# Patient Record
Sex: Male | Born: 1985 | Race: White | Hispanic: No | Marital: Married | Smoking: Current every day smoker
Health system: Southern US, Community
[De-identification: ages and names within clinical notes are randomized; demographics above are authoritative.]

---
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
## 2004-02-17 ENCOUNTER — Emergency Department (HOSPITAL_COMMUNITY): Admission: EM | Admit: 2004-02-17 | Discharge: 2004-02-17 | Payer: Self-pay | Admitting: Emergency Medicine

## 2004-09-04 ENCOUNTER — Emergency Department (HOSPITAL_COMMUNITY): Admission: EM | Admit: 2004-09-04 | Discharge: 2004-09-04 | Payer: Self-pay | Admitting: Emergency Medicine

## 2004-09-07 ENCOUNTER — Emergency Department (HOSPITAL_COMMUNITY): Admission: EM | Admit: 2004-09-07 | Discharge: 2004-09-07 | Payer: Self-pay | Admitting: Emergency Medicine

## 2007-01-03 ENCOUNTER — Emergency Department (HOSPITAL_COMMUNITY): Admission: EM | Admit: 2007-01-03 | Discharge: 2007-01-03 | Payer: Self-pay | Admitting: Emergency Medicine

## 2007-05-09 ENCOUNTER — Emergency Department (HOSPITAL_COMMUNITY): Admission: EM | Admit: 2007-05-09 | Discharge: 2007-05-09 | Payer: Self-pay | Admitting: Emergency Medicine

## 2009-02-13 ENCOUNTER — Emergency Department (HOSPITAL_BASED_OUTPATIENT_CLINIC_OR_DEPARTMENT_OTHER): Admission: EM | Admit: 2009-02-13 | Discharge: 2009-02-13 | Payer: Self-pay | Admitting: Emergency Medicine

## 2010-04-16 ENCOUNTER — Emergency Department (HOSPITAL_BASED_OUTPATIENT_CLINIC_OR_DEPARTMENT_OTHER)
Admission: EM | Admit: 2010-04-16 | Discharge: 2010-04-16 | Disposition: A | Discharge status: Home / Self Care | Payer: Self-pay | Attending: Emergency Medicine | Admitting: Emergency Medicine

## 2010-04-16 DIAGNOSIS — H5789 Other specified disorders of eye and adnexa: Secondary | ICD-10-CM | POA: Insufficient documentation

## 2010-04-16 DIAGNOSIS — F17200 Nicotine dependence, unspecified, uncomplicated: Secondary | ICD-10-CM | POA: Insufficient documentation

## 2010-04-16 DIAGNOSIS — H109 Unspecified conjunctivitis: Secondary | ICD-10-CM | POA: Insufficient documentation

## 2010-07-08 ENCOUNTER — Emergency Department (HOSPITAL_BASED_OUTPATIENT_CLINIC_OR_DEPARTMENT_OTHER)
Admission: EM | Admit: 2010-07-08 | Discharge: 2010-07-08 | Disposition: A | Discharge status: Home / Self Care | Payer: Self-pay | Attending: Emergency Medicine | Admitting: Emergency Medicine

## 2010-07-08 DIAGNOSIS — H5710 Ocular pain, unspecified eye: Secondary | ICD-10-CM | POA: Insufficient documentation

## 2011-05-19 ENCOUNTER — Emergency Department (HOSPITAL_COMMUNITY)
Admission: EM | Admit: 2011-05-19 | Discharge: 2011-05-19 | Disposition: A | Discharge status: Home / Self Care | Payer: Self-pay | Attending: Emergency Medicine | Admitting: Emergency Medicine

## 2011-05-19 ENCOUNTER — Emergency Department (HOSPITAL_COMMUNITY): Payer: Self-pay

## 2011-05-19 ENCOUNTER — Encounter (HOSPITAL_COMMUNITY): Payer: Self-pay | Admitting: *Deleted

## 2011-05-19 DIAGNOSIS — M79609 Pain in unspecified limb: Secondary | ICD-10-CM | POA: Insufficient documentation

## 2011-05-19 DIAGNOSIS — M79641 Pain in right hand: Secondary | ICD-10-CM

## 2011-05-19 DIAGNOSIS — R296 Repeated falls: Secondary | ICD-10-CM | POA: Insufficient documentation

## 2011-05-19 DIAGNOSIS — Y9389 Activity, other specified: Secondary | ICD-10-CM | POA: Insufficient documentation

## 2011-05-19 MED ORDER — NAPROXEN 500 MG PO TABS: 500.0000 mg | ORAL_TABLET | Freq: Two times a day (BID) | ORAL | Status: AC

## 2011-05-19 NOTE — ED Provider Notes (Signed)
History     CSN: [id number]  Arrival date & time 05/19/11  1625   First MD Initiated Contact with Patient 05/19/11 1719      Chief Complaint  Patient presents with  . Fall  . Arm Pain    (Consider location/radiation/quality/duration/timing/severity/associated sxs/prior treatment) Patient is a 26 y.o. male presenting with fall and arm pain. The history is provided by the patient. No language interpreter was used.  Fall The fall occurred while recreating/playing. He fell from a height of 1 to 2 ft. He landed on grass. There was no blood loss. Point of impact: right hand. Pain location: right arm, extending to elbow. The pain is mild. He was ambulatory at the scene. There was no entrapment after the fall. There was no drug use involved in the accident. There was no alcohol use involved in the accident. The symptoms are aggravated by activity. He has tried nothing for the symptoms.  Arm Pain Associated symptoms include arthralgias.    History reviewed. No pertinent past medical history.  History reviewed. No pertinent past surgical history.  History reviewed. No pertinent family history.  History  Substance Use Topics  . Smoking status: Not on file  . Smokeless tobacco: Not on file  . Alcohol Use: Not on file      Review of Systems  Musculoskeletal: Positive for arthralgias.  All other systems reviewed and are negative.    Allergies  Review of patient's allergies indicates not on file.  Home Medications   Current Outpatient Rx  Name Route Sig Dispense Refill  . ACETAMINOPHEN 500 MG PO TABS Oral Take 1,500 mg by mouth every 6 (six) hours as needed. For pain    . NAPROXEN SODIUM 220 MG PO TABS Oral Take 440 mg by mouth every 12 (twelve) hours as needed. For pain      BP 127/61  Pulse 95  Temp(Src) 98 F (36.7 C) (Oral)  Resp 16  SpO2 98%  Physical Exam  Nursing note and vitals reviewed. Constitutional: He is oriented to person, place, and time. He appears  well-developed and well-nourished. No distress.  HENT:  Head: Normocephalic and atraumatic.  Eyes: Pupils are equal, round, and reactive to light.  Neck: Normal range of motion. Neck supple.  Cardiovascular: Normal rate, regular rhythm, normal heart sounds and intact distal pulses.   Pulmonary/Chest: Effort normal and breath sounds normal.  Abdominal: Soft.  Musculoskeletal: Normal range of motion. He exhibits tenderness.  Lymphadenopathy:    He has no cervical adenopathy.  Neurological: He is alert and oriented to person, place, and time.  Skin: Skin is warm and dry.  Psychiatric: He has a normal mood and affect. His behavior is normal. Judgment and thought content normal.    ED Course  Procedures (including critical care time)  Labs Reviewed - No data to display No results found.   No diagnosis found. Prior injury to right hand several months ago, not evaluated.  Recent fall with increase in pain of right hand.  Pain radiates to right elbow.  No deformity.  Strong grip strength.  No limitation in ROM of hand, wrist, or elbow.  Radiology results reviewed and discussed with patient.   Hand pain. MDM          Jimmye Norman, NP 05/19/11 1843

## 2011-05-19 NOTE — Discharge Instructions (Signed)
Musculoskeletal Pain   Musculoskeletal pain is muscle and boney aches and pains. These pains can occur in any part of the body. Your caregiver may treat you without knowing the cause of the pain. They may treat you if blood or urine tests, X-rays, and other tests were normal.   CAUSES   There is often not a definite cause or reason for these pains. These pains may be caused by a type of germ (virus). The discomfort may also come from overuse. Overuse includes working out too hard when your body is not fit. Boney aches also come from weather changes. Bone is sensitive to atmospheric pressure changes.   HOME CARE INSTRUCTIONS   Ask when your test results will be ready. Make sure you get your test results.   Only take over-the-counter or prescription medicines for pain, discomfort, or fever as directed by your caregiver. If you were given medications for your condition, do not drive, operate machinery or power tools, or sign legal documents for 24 hours. Do not drink alcohol. Do not take sleeping pills or other medications that may interfere with treatment.   Continue all activities unless the activities cause more pain. When the pain lessens, slowly resume normal activities. Gradually increase the intensity and duration of the activities or exercise.   During periods of severe pain, bed rest may be helpful. Lay or sit in any position that is comfortable.   Putting ice on the injured area.   Put ice in a bag.   Place a towel between your skin and the bag.   Leave the ice on for 15 to 20 minutes, 3 to 4 times a day.   Follow up with your caregiver for continued problems and no reason can be found for the pain. If the pain becomes worse or does not go away, it may be necessary to repeat tests or do additional testing. Your caregiver may need to look further for a possible cause.   SEEK IMMEDIATE MEDICAL CARE IF:   You have pain that is getting worse and is not relieved by medications.   You develop chest pain that is  associated with shortness or breath, sweating, feeling sick to your stomach (nauseous), or throw up (vomit).   Your pain becomes localized to the abdomen.   You develop any new symptoms that seem different or that concern you.   MAKE SURE YOU:   Understand these instructions.   Will watch your condition.   Will get help right away if you are not doing well or get worse.   Document Released: 01/13/2005 Document Revised: 01/02/2011 Document Reviewed: 09/03/2007   [employer]® Patient Information ©2012 [employer].

## 2011-05-19 NOTE — ED Notes (Signed)
To ED for eval of right arm pain since falling in yard while playing with son. Ambulatory. No deformity noted

## 2011-05-19 NOTE — ED Provider Notes (Signed)
Medical screening examination/treatment/procedure(s) were performed by non-physician practitioner and as supervising physician I was immediately available for consultation/collaboration.   Lyanne Co, MD 05/19/11 2026

## 2011-05-19 NOTE — ED Notes (Signed)
Pt has returned from xray

## 2012-09-12 IMAGING — CR DG HAND COMPLETE 3+V*R*
3 series · 3 of 3 positions shown · non-contrast
Comparison: 09/04/2004

CLINICAL DATA: Fall, right hand pain

RIGHT HAND - COMPLETE 3+ VIEW

[x hand pa right]
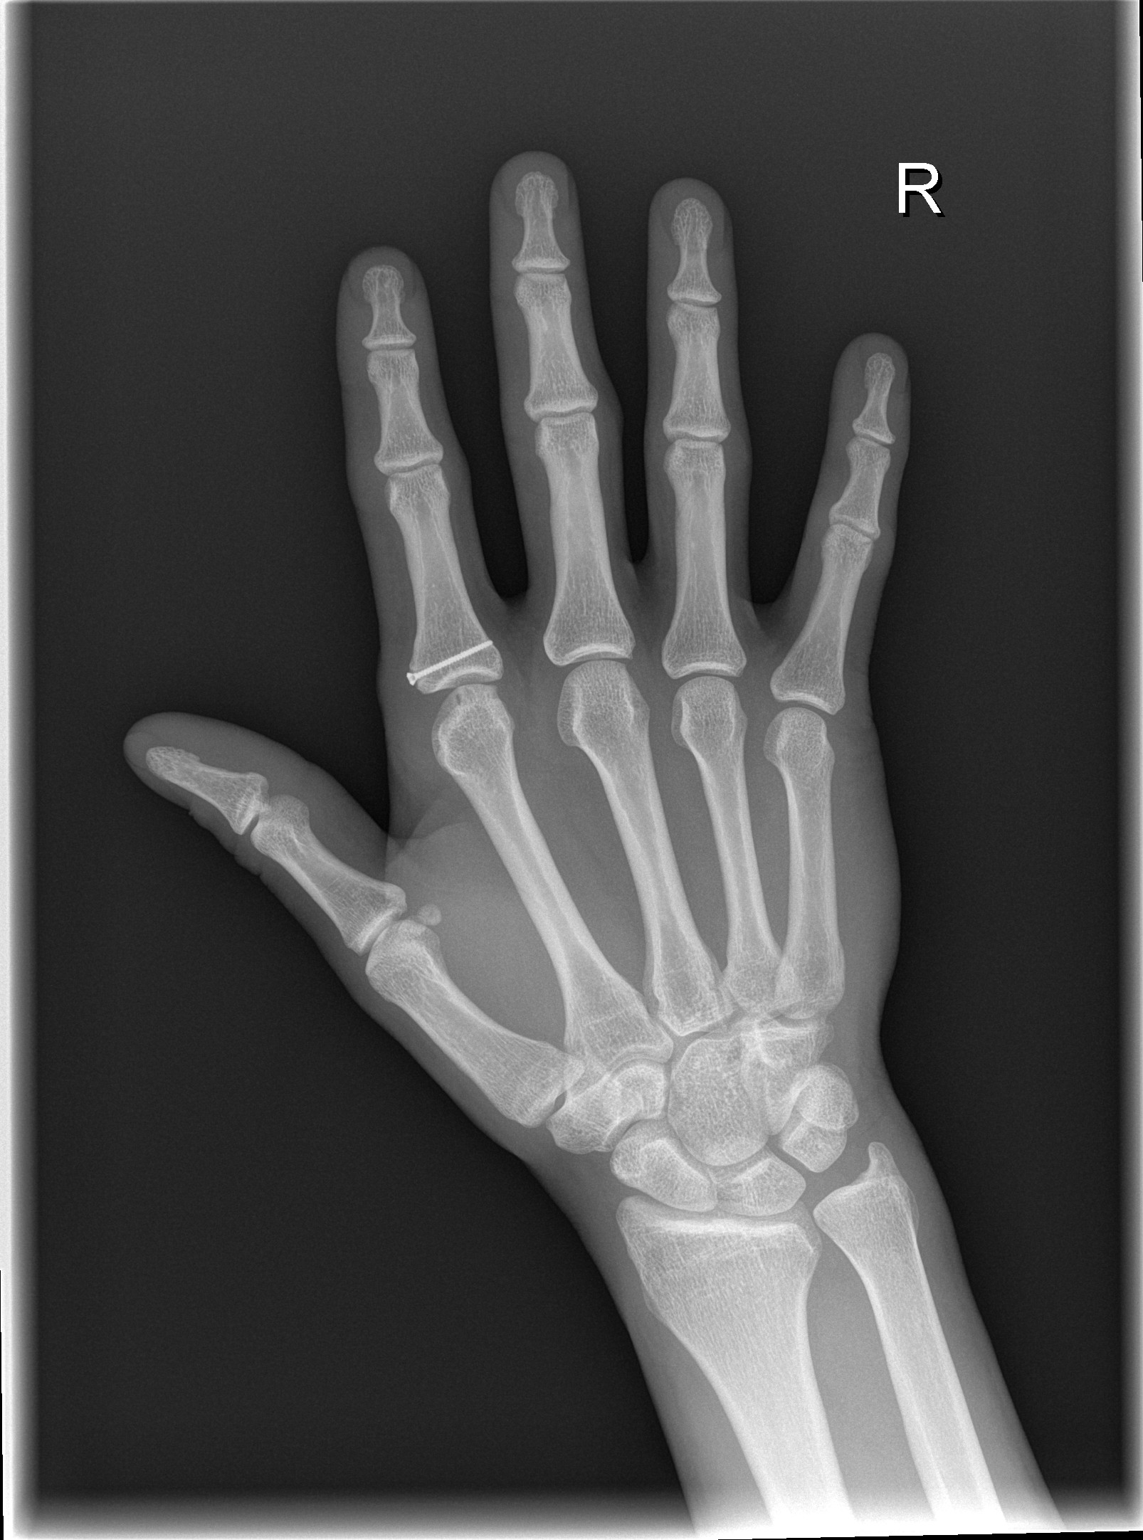

[x hand oblique right]
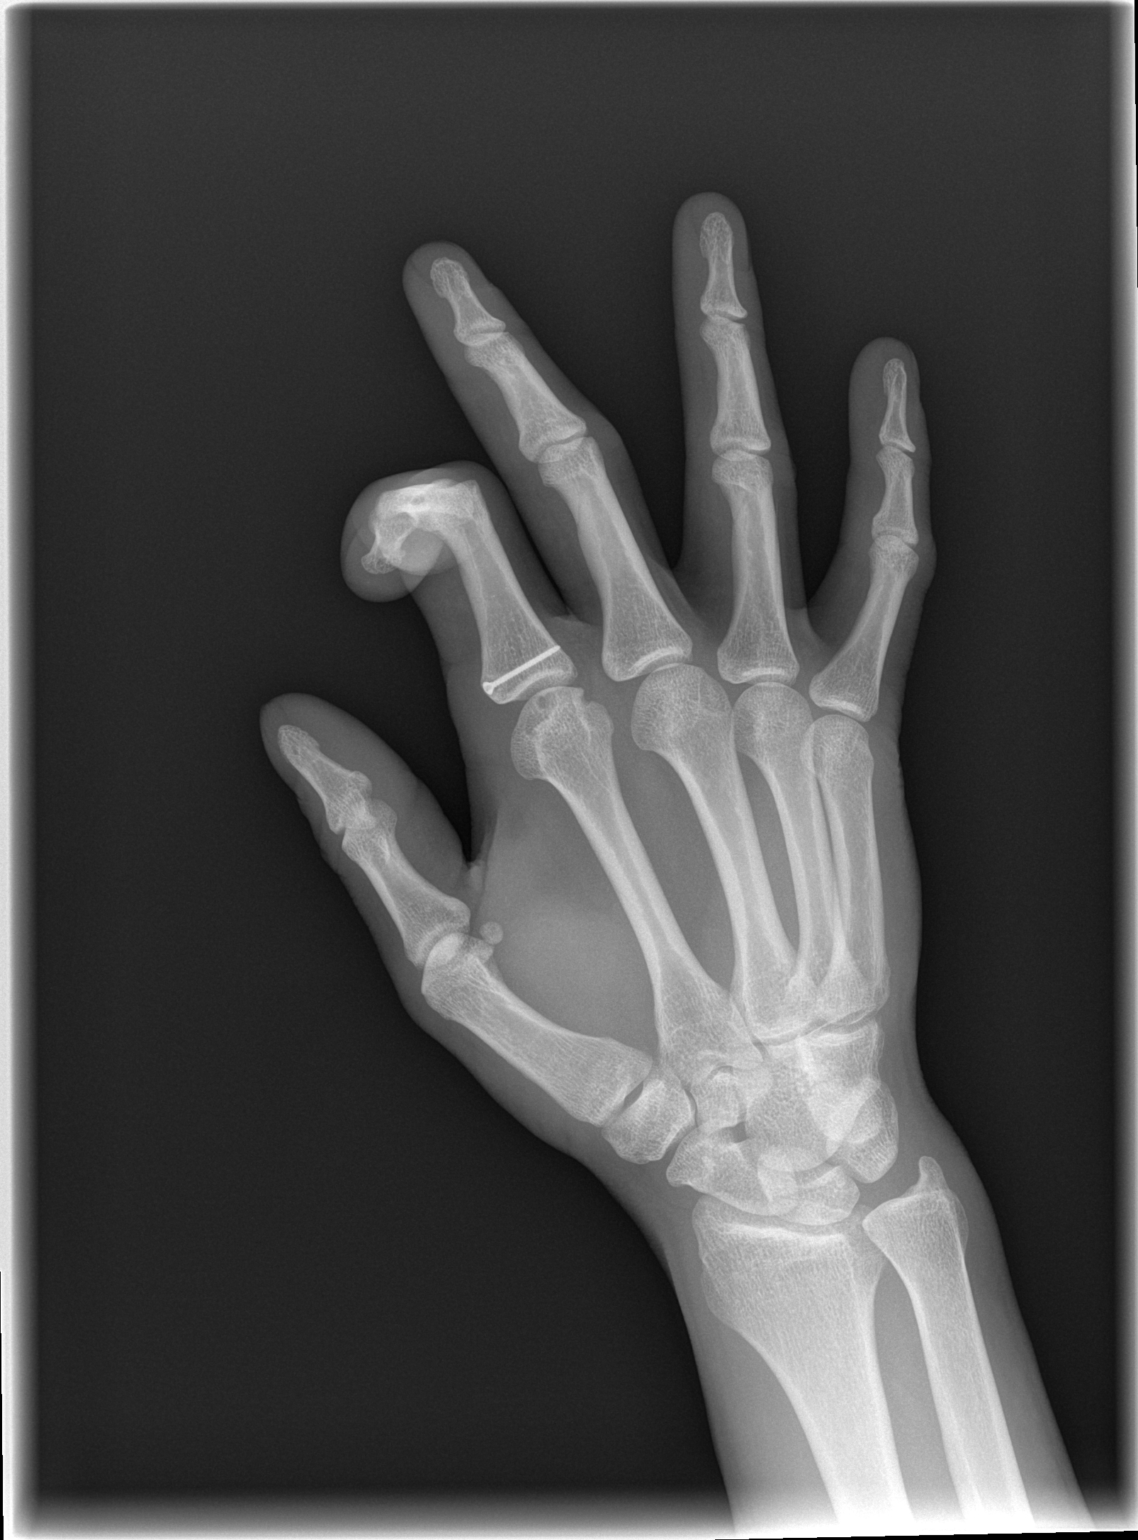

[x hand lat right]
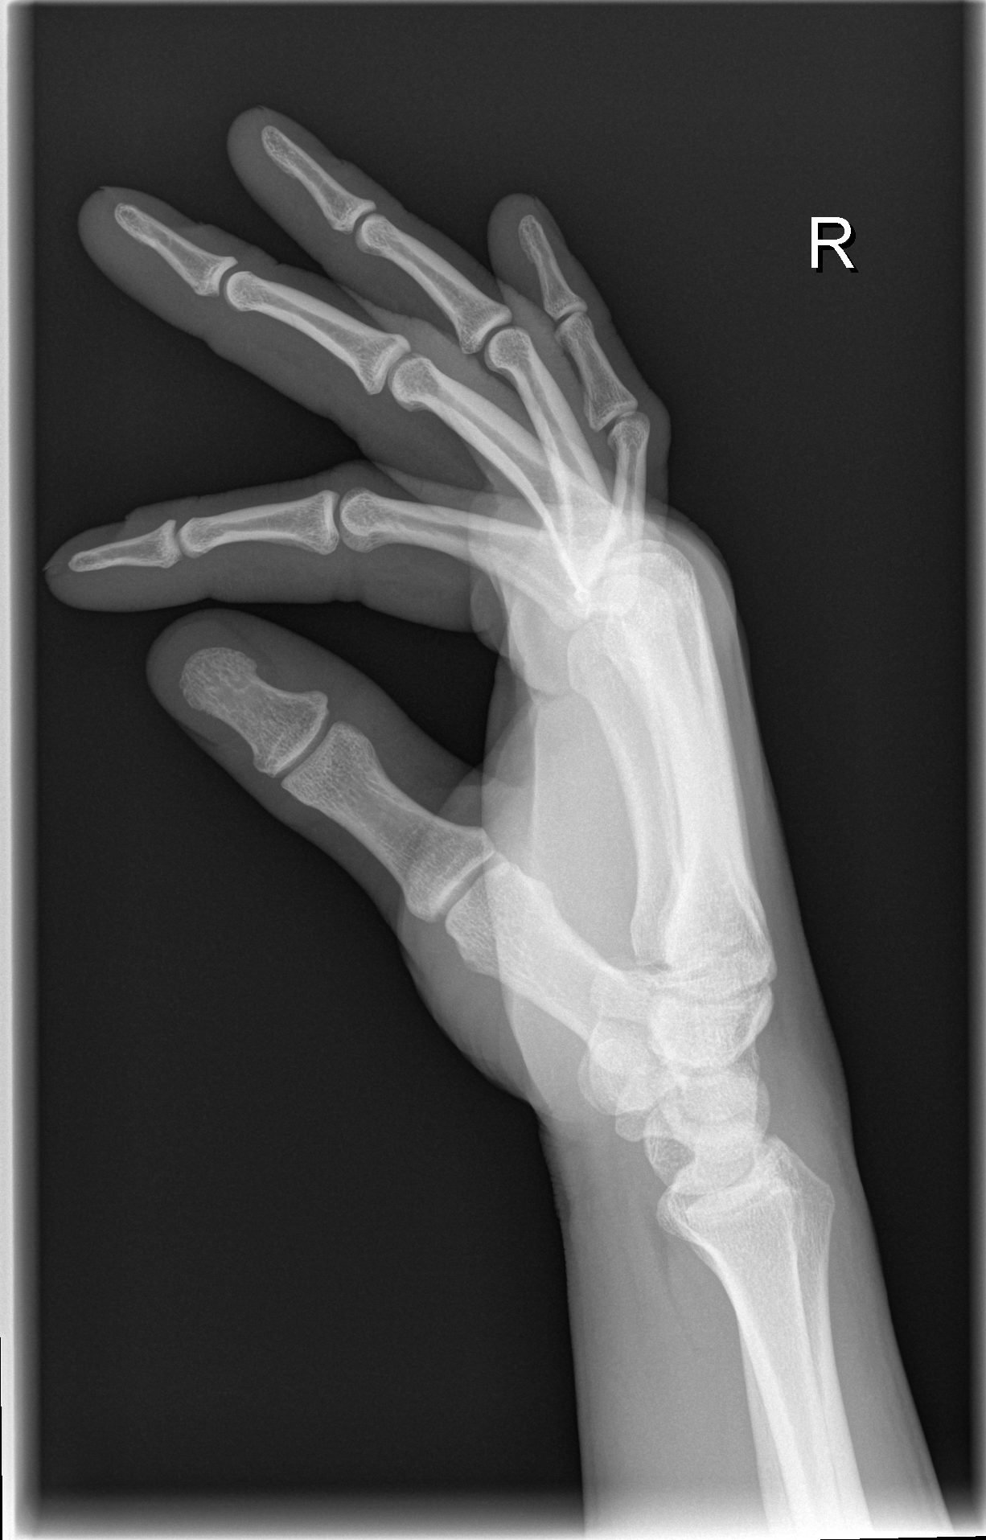

[3 of 3 positions shown; findings below may reference images not displayed]

FINDINGS: A screw traverses the base of the proximal phalanx of the
right second digit.  No fracture or dislocation.  No radiopaque
foreign body.  Subchondral cyst or periarticular erosion noted at
the head of the second metacarpal.
IMPRESSION: No acute finding.  Probable degenerative change of the head of the
second metacarpal.

## 2019-06-02 ENCOUNTER — Encounter (HOSPITAL_BASED_OUTPATIENT_CLINIC_OR_DEPARTMENT_OTHER): Payer: Self-pay

## 2019-06-02 ENCOUNTER — Emergency Department (HOSPITAL_BASED_OUTPATIENT_CLINIC_OR_DEPARTMENT_OTHER)
Admission: EM | Admit: 2019-06-02 | Discharge: 2019-06-02 | Disposition: A | Discharge status: Home / Self Care | Payer: Self-pay | Attending: Emergency Medicine | Admitting: Emergency Medicine

## 2019-06-02 ENCOUNTER — Other Ambulatory Visit: Payer: Self-pay

## 2019-06-02 DIAGNOSIS — H5711 Ocular pain, right eye: Secondary | ICD-10-CM | POA: Insufficient documentation

## 2019-06-02 DIAGNOSIS — Z5321 Procedure and treatment not carried out due to patient leaving prior to being seen by health care provider: Secondary | ICD-10-CM | POA: Insufficient documentation

## 2019-06-02 NOTE — ED Triage Notes (Signed)
Pt states he got metal from a grinder in his right eye yesterday-NAD-steady gait

## 2019-06-05 ENCOUNTER — Other Ambulatory Visit: Payer: Self-pay

## 2019-06-05 ENCOUNTER — Emergency Department (HOSPITAL_BASED_OUTPATIENT_CLINIC_OR_DEPARTMENT_OTHER)
Admission: EM | Admit: 2019-06-05 | Discharge: 2019-06-05 | Disposition: A | Discharge status: Home / Self Care | Payer: Medicaid Other | Attending: Emergency Medicine | Admitting: Emergency Medicine

## 2019-06-05 ENCOUNTER — Encounter (HOSPITAL_BASED_OUTPATIENT_CLINIC_OR_DEPARTMENT_OTHER): Payer: Self-pay

## 2019-06-05 DIAGNOSIS — Y999 Unspecified external cause status: Secondary | ICD-10-CM | POA: Insufficient documentation

## 2019-06-05 DIAGNOSIS — Y9289 Other specified places as the place of occurrence of the external cause: Secondary | ICD-10-CM | POA: Insufficient documentation

## 2019-06-05 DIAGNOSIS — Z79899 Other long term (current) drug therapy: Secondary | ICD-10-CM | POA: Insufficient documentation

## 2019-06-05 DIAGNOSIS — Y9389 Activity, other specified: Secondary | ICD-10-CM | POA: Insufficient documentation

## 2019-06-05 DIAGNOSIS — X58XXXA Exposure to other specified factors, initial encounter: Secondary | ICD-10-CM | POA: Insufficient documentation

## 2019-06-05 DIAGNOSIS — S01141A Puncture wound with foreign body of right eyelid and periocular area, initial encounter: Secondary | ICD-10-CM | POA: Insufficient documentation

## 2019-06-05 DIAGNOSIS — S0501XA Injury of conjunctiva and corneal abrasion without foreign body, right eye, initial encounter: Secondary | ICD-10-CM | POA: Insufficient documentation

## 2019-06-05 DIAGNOSIS — F17210 Nicotine dependence, cigarettes, uncomplicated: Secondary | ICD-10-CM | POA: Insufficient documentation

## 2019-06-05 MED ORDER — FLUORESCEIN SODIUM 1 MG OP STRP
1.0000 | ORAL_STRIP | Freq: Once | OPHTHALMIC | Status: AC
  Administered 2019-06-05: 1 via OPHTHALMIC

## 2019-06-05 MED ORDER — TETRACAINE HCL 0.5 % OP SOLN
OPHTHALMIC | Status: AC
  Filled 2019-06-05: qty 4

## 2019-06-05 MED ORDER — TETRACAINE HCL 0.5 % OP SOLN
2.0000 [drp] | Freq: Once | OPHTHALMIC | Status: AC
  Administered 2019-06-05: 2 [drp] via OPHTHALMIC

## 2019-06-05 MED ORDER — OFLOXACIN 0.3 % OP SOLN
2.0000 [drp] | Freq: Four times a day (QID) | OPHTHALMIC | Status: DC
  Administered 2019-06-05: 2 [drp] via OPHTHALMIC
  Filled 2019-06-05: qty 5

## 2019-06-05 MED ORDER — FLUORESCEIN SODIUM 1 MG OP STRP
ORAL_STRIP | OPHTHALMIC | Status: AC
  Filled 2019-06-05: qty 1

## 2019-06-05 NOTE — ED Provider Notes (Signed)
[facility] EMERGENCY DEPARTMENT Provider Note  CSN: [id number] Arrival date & time: 06/05/19 0019  Chief Complaint(s) Eye Problem  HPI Zachary Anderson is a 34 y.o. male   The history is provided by the patient.  Eye Problem Location:  Right eye Quality:  Aching Severity:  Moderate Onset quality:  Gradual Duration:  3 days Timing:  Constant Progression:  Waxing and waning Chronicity:  New Context: foreign body   Foreign body:  Metal Relieved by:  Nothing Worsened by:  Contact and eye movement Associated symptoms: inflammation and redness   Risk factors: conjunctival hemorrhage     Past Medical History History reviewed. No pertinent past medical history. There are no problems to display for this patient.  Home Medication(s) Prior to Admission medications   Medication Sig Start Date End Date Taking? Authorizing Provider  acetaminophen (TYLENOL) 500 MG tablet Take 1,500 mg by mouth every 6 (six) hours as needed. For pain    [provider]  naproxen sodium (ANAPROX) 220 MG tablet Take 440 mg by mouth every 12 (twelve) hours as needed. For pain    [provider]                                                                                                                                    Past Surgical History History reviewed. No pertinent surgical history. Family History No family history on file.  Social History Social History   Tobacco Use  . Smoking status: Current Every Day Smoker    Types: Cigarettes  . Smokeless tobacco: Never Used  Substance Use Topics  . Alcohol use: Never  . Drug use: Yes    Types: Marijuana   Allergies Patient has no known allergies.  Review of Systems Review of Systems  Eyes: Positive for redness.   All other systems are reviewed and are negative for acute change except as noted in the HPI  Physical Exam Vital Signs  I have reviewed the triage vital signs BP 133/83 (BP Location: Right Arm)    Pulse 99   Temp 97.9 F (36.6 C) (Oral)   Resp 18   Ht 6' (1.829 m)   Wt 68.5 kg   SpO2 99%   BMI 20.48 kg/m   Physical Exam Vitals reviewed.  Constitutional:      General: He is not in acute distress.    Appearance: He is well-developed. He is not diaphoretic.  HENT:     Head: Normocephalic and atraumatic.     Jaw: No trismus.     Right Ear: External ear normal.     Left Ear: External ear normal.     Nose: Nose normal.  Eyes:     General: No scleral icterus.       Right eye: Foreign body (punctate, dark colored, on upper lid) present.     Conjunctiva/sclera:     Right eye: Right conjunctiva is injected. Hemorrhage  present. No exudate.    Pupils: Pupils are equal, round, and reactive to light.     Right eye: Corneal abrasion and fluorescein uptake present. Seidel exam negative.     Slit lamp exam:    Right eye: No hyphema or hypopyon.  Neck:     Trachea: Phonation normal.  Cardiovascular:     Rate and Rhythm: Normal rate and regular rhythm.  Pulmonary:     Effort: Pulmonary effort is normal. No respiratory distress.     Breath sounds: No stridor.  Abdominal:     General: There is no distension.  Musculoskeletal:        General: Normal range of motion.     Cervical back: Normal range of motion.  Neurological:     Mental Status: He is alert and oriented to person, place, and time.  Psychiatric:        Behavior: Behavior normal.     ED Results and Treatments Labs (all labs ordered are listed, but only abnormal results are displayed) Labs Reviewed - No data to display                                                                                                                       EKG  EKG Interpretation  Date/Time:    Ventricular Rate:    PR Interval:    QRS Duration:   QT Interval:    QTC Calculation:   R Axis:     Text Interpretation:        Radiology No results found.  Pertinent labs & imaging results that were available during my care of the  patient were reviewed by me and considered in my medical decision making (see chart for details).  Medications Ordered in ED Medications  tetracaine (PONTOCAINE) 0.5 % ophthalmic solution 2 drop (has no administration in time range)  fluorescein ophthalmic strip 1 strip (has no administration in time range)  fluorescein 1 MG ophthalmic strip (has no administration in time range)  tetracaine (PONTOCAINE) 0.5 % ophthalmic solution (has no administration in time range)  ofloxacin (OCUFLOX) 0.3 % ophthalmic solution 2 drop (has no administration in time range)                                                                                                                                    Procedures .Foreign Body Removal  Date/Time: 06/05/2019 2:28 AM Performed by: Drema Pry  Johnsie Cancel, MD Authorized by: Fatima Blank, MD  Consent: Verbal consent obtained. Consent given by: patient Patient understanding: patient states understanding of the procedure being performed Patient identity confirmed: verbally with patient Body area: eye Location details: right eyelid  Sedation: Patient sedated: no  Localization method: eyelid eversion, magnification and visualized Removal mechanism: moist cotton swab Eye examined with fluorescein. Fluorescein uptake. Corneal abrasion size: medium Corneal abrasion location: superior and lateral No residual rust ring present. Dressing: antibiotic drops Depth: superficial Complexity: simple 1 objects recovered. Objects recovered: metal Post-procedure assessment: foreign body removed Patient tolerance: patient tolerated the procedure well with no immediate complications    (including critical care time)  Medical Decision Making / ED Course I have reviewed the nursing notes for this encounter and the patient's prior records (if available in EHR or on provided paperwork).   JENNIFER HOLLAND was evaluated in Emergency Department on 06/05/2019 for  the symptoms described in the history of present illness. He was evaluated in the context of the global COVID-19 pandemic, which necessitated consideration that the patient might be at risk for infection with the SARS-CoV-2 virus that causes COVID-19. Institutional protocols and algorithms that pertain to the evaluation of patients at risk for COVID-19 are in a state of rapid change based on information released by regulatory bodies including the CDC and federal and state organizations. These policies and algorithms were followed during the patient's care in the ED.  Right eye metallic FB with corneal abrasion vs ulcer. Lid FB removed. No cellulitis noted. Ofloxacin drops given. Diluted tetracaine drops given for pain. Ophtho follow up.        Final Clinical Impression(s) / ED Diagnoses Final diagnoses:  Puncture wound of eyelid of right eye with foreign body, initial encounter  Abrasion of right cornea, initial encounter   The patient appears reasonably screened and/or stabilized for discharge and I doubt any other medical condition or other [facility] requiring further screening, evaluation, or treatment in the ED at this time prior to discharge. Safe for discharge with strict return precautions.  Disposition: Discharge  Condition: Good  I have discussed the results, Dx and Tx plan with the patient/family who expressed understanding and agree(s) with the plan. Discharge instructions discussed at length. The patient/family was given strict return precautions who verbalized understanding of the instructions. No further questions at time of discharge.    ED Discharge Orders    None       Follow Up: [facility], [address] [place] [id number] [phone]  Schedule an appointment as soon as possible for a visit  For close follow up to assess for eye scratches      This chart was dictated using voice recognition software.  Despite best efforts to proofread,   errors can occur which can change the documentation meaning.   Fatima Blank, MD 06/05/19 0230

## 2019-06-05 NOTE — Discharge Instructions (Addendum)
Tetracaine drop (numbing drop) use: 1 - 2 drops in affected eye q30-60 min as needed pain Disp: 2 - 3 mL **Not to exceed 24 - 48 hours of use

## 2019-06-05 NOTE — ED Triage Notes (Signed)
Pt states he has a foreign object in his R eye. Happened 3 days ago.

## 2020-03-15 ENCOUNTER — Ambulatory Visit (HOSPITAL_COMMUNITY): Payer: No Payment, Other | Admitting: Licensed Clinical Social Worker

## 2020-03-15 ENCOUNTER — Ambulatory Visit (HOSPITAL_COMMUNITY): Payer: No Payment, Other | Admitting: Psychiatry

## 2020-03-29 ENCOUNTER — Ambulatory Visit (HOSPITAL_COMMUNITY): Payer: No Payment, Other | Admitting: Physician Assistant

## 2020-06-08 ENCOUNTER — Other Ambulatory Visit: Payer: Self-pay

## 2020-06-08 ENCOUNTER — Emergency Department (HOSPITAL_BASED_OUTPATIENT_CLINIC_OR_DEPARTMENT_OTHER)
Admission: EM | Admit: 2020-06-08 | Discharge: 2020-06-08 | Disposition: A | Discharge status: Home / Self Care | Payer: Medicaid Other | Attending: Emergency Medicine | Admitting: Emergency Medicine

## 2020-06-08 ENCOUNTER — Encounter (HOSPITAL_BASED_OUTPATIENT_CLINIC_OR_DEPARTMENT_OTHER): Payer: Self-pay | Admitting: *Deleted

## 2020-06-08 DIAGNOSIS — F17210 Nicotine dependence, cigarettes, uncomplicated: Secondary | ICD-10-CM | POA: Insufficient documentation

## 2020-06-08 DIAGNOSIS — W208XXA Other cause of strike by thrown, projected or falling object, initial encounter: Secondary | ICD-10-CM | POA: Insufficient documentation

## 2020-06-08 DIAGNOSIS — S0502XA Injury of conjunctiva and corneal abrasion without foreign body, left eye, initial encounter: Secondary | ICD-10-CM | POA: Insufficient documentation

## 2020-06-08 DIAGNOSIS — S058X2A Other injuries of left eye and orbit, initial encounter: Secondary | ICD-10-CM

## 2020-06-08 DIAGNOSIS — Y9389 Activity, other specified: Secondary | ICD-10-CM | POA: Insufficient documentation

## 2020-06-08 MED ORDER — FLUORESCEIN SODIUM 1 MG OP STRP
1.0000 | ORAL_STRIP | Freq: Once | OPHTHALMIC | Status: AC
  Administered 2020-06-08: 1 via OPHTHALMIC
  Filled 2020-06-08: qty 1

## 2020-06-08 MED ORDER — TETRACAINE HCL 0.5 % OP SOLN
2.0000 [drp] | Freq: Once | OPHTHALMIC | Status: AC
  Administered 2020-06-08: 2 [drp] via OPHTHALMIC
  Filled 2020-06-08: qty 4

## 2020-06-08 MED ORDER — ERYTHROMYCIN 5 MG/GM OP OINT
TOPICAL_OINTMENT | Freq: Once | OPHTHALMIC | Status: AC
  Administered 2020-06-08: 1 via OPHTHALMIC
  Filled 2020-06-08: qty 3.5

## 2020-06-08 NOTE — ED Provider Notes (Signed)
[facility] EMERGENCY DEPARTMENT Provider Note   CSN: [id number] Arrival date & time: 06/08/20  1737     History Chief Complaint  Patient presents with  . Foreign Body in Eye    Zachary Anderson is a 35 y.o. male.  Patient presents to the emergency department today for evaluation of left eye irritation.  Patient states that he broke a glass car lightbulb the day before yesterday.  He states that he had a piece of glass embedded in his finger and when he used a knife to get the glass out of his finger, he had a foreign body go into his eye.  He has had irritation since that time.  No vision changes or vision loss.  His eye is red no surrounding swelling.  No fevers.  No treatments prior to arrival.  He does not wear contact lenses or glasses.  No previous surgeries of the eye.        History reviewed. No pertinent past medical history.  There are no problems to display for this patient.   History reviewed. No pertinent surgical history.     No family history on file.  Social History   Tobacco Use  . Smoking status: Current Every Day Smoker    Types: Cigarettes  . Smokeless tobacco: Never Used  Vaping Use  . Vaping Use: Never used  Substance Use Topics  . Alcohol use: Never  . Drug use: Yes    Types: Marijuana    Home Medications Prior to Admission medications   Medication Sig Start Date End Date Taking? Authorizing Provider  acetaminophen (TYLENOL) 500 MG tablet Take 1,500 mg by mouth every 6 (six) hours as needed. For pain    [provider]  naproxen sodium (ANAPROX) 220 MG tablet Take 440 mg by mouth every 12 (twelve) hours as needed. For pain    [provider]    Allergies    Patient has no known allergies.  Review of Systems   Review of Systems  Constitutional: Negative for fever.  HENT: Negative for rhinorrhea and sore throat.   Eyes: Positive for pain, discharge (tearing) and redness. Negative for photophobia and visual  disturbance.  Respiratory: Negative for cough.   Cardiovascular: Negative for chest pain.  Gastrointestinal: Negative for abdominal pain, diarrhea, nausea and vomiting.  Genitourinary: Negative for dysuria and hematuria.  Musculoskeletal: Negative for myalgias.  Skin: Negative for rash.  Neurological: Negative for headaches.    Physical Exam Updated Vital Signs BP 126/86 (BP Location: Right Arm)   Pulse 85   Temp 98.4 F (36.9 C) (Oral)   Resp 18   Ht 6' (1.829 m)   Wt 69.4 kg   SpO2 98%   BMI 20.75 kg/m   Physical Exam Vitals and nursing note reviewed.  Constitutional:      Appearance: He is well-developed.  HENT:     Head: Normocephalic and atraumatic.  Eyes:     Extraocular Movements:     Right eye: Normal extraocular motion.     Left eye: Normal extraocular motion.     Conjunctiva/sclera:     Right eye: Right conjunctiva is not injected.     Left eye: Left conjunctiva is injected.     Pupils: Pupils are equal, round, and reactive to light.     Right eye: Pupil is round, reactive and not sluggish.     Left eye: Pupil is round, reactive and not sluggish. Fluorescein uptake present. Seidel exam negative.  Slit lamp exam:    Left eye: No corneal ulcer or foreign body.     Comments: L sclera: there is injection and mild chemosis involving most of the lower half of the eye.  There is a small hemorrhage noted on the sclera about the 6 o'clock position.  There is very mild hazy fluorescein uptake of the outer half of this cornea without any discrete linear uptake suspicious for corneal abrasion.  No foreign body seen with eversion of the upper lid, lower lid.  I do not see any corneal foreign bodies.  I do not see any glass foreign bodies in the sclera when plate is applied tangentially with the ophthalmoscope.  Pulmonary:     Effort: No respiratory distress.  Musculoskeletal:     Cervical back: Normal range of motion and neck supple.  Skin:    General: Skin is warm and  dry.  Neurological:     Mental Status: He is alert.     ED Results / Procedures / Treatments   Labs (all labs ordered are listed, but only abnormal results are displayed) Labs Reviewed - No data to display  EKG None  Radiology No results found.  Procedures Procedures   Medications Ordered in ED Medications  erythromycin ophthalmic ointment (has no administration in time range)  fluorescein ophthalmic strip 1 strip (1 strip Right Eye Given 06/08/20 1809)  tetracaine (PONTOCAINE) 0.5 % ophthalmic solution 2 drop (2 drops Right Eye Given 06/08/20 1809)    ED Course  I have reviewed the triage vital signs and the nursing notes.  Pertinent labs & imaging results that were available during my care of the patient were reviewed by me and considered in my medical decision making (see chart for details).  Patient seen and examined.  Exam findings as above.  The sclera is most inflamed over the inferior half of the eye.  Suspect patient had a foreign body, which is now present, cause injury to the sclera.  He may have a very mild corneal abrasion laterally.  I spent a significant amount of time trying to identify any residual foreign bodies however was unable to do so.  Patient does have some tearing during exam likely contributing to decreased visual acuity.  His pupil responds appropriately to light.  He has full EOMs without deficit.  Vital signs reviewed and are as follows: BP 126/86 (BP Location: Right Arm)   Pulse 85   Temp 98.4 F (36.9 C) (Oral)   Resp 18   Ht 6' (1.829 m)   Wt 69.4 kg   SpO2 98%   BMI 20.75 kg/m    Will start on erythromycin ointment.  Today is Friday, if not improved by Monday, he is instructed to call ophthalmology referral.  If he develops any worsening pain, swelling around the eye, fever over the weekend he is to return for recheck.  Patient verbalized understanding agrees with plan.     Visual Acuity  Right Eye Distance: 20/30 Left Eye  Distance: 20/200 (only able to see top line r/t inury to eye) Bilateral Distance: 20/30  Right Eye Near:   Left Eye Near:    Bilateral Near:        MDM Rules/Calculators/A&P                          Patient with eye foreign body, likely causing scleral and corneal abrasion.  Gross vision is intact.  Do not suspect corneal laceration or  open globe.  No foreign bodies noted despite extensive exam. No surrounding erythema, swelling, vision changes/loss suspicious for orbital or periorbital cellulitis. No signs of iritis. No symptoms of retinal detachment. No ophthalmologic emergency suspected. Outpatient referral given in case of no/slow improvement.  Home with antibiotic ointment.    Final Clinical Impression(s) / ED Diagnoses Final diagnoses:  Abrasion of left cornea, initial encounter  Abrasion of sclera of left eye, initial encounter    Rx / DC Orders ED Discharge Orders    None       Renne Crigler, Cordelia Poche 06/08/20 1837    Jacalyn Lefevre, MD 06/08/20 1902

## 2020-06-08 NOTE — Discharge Instructions (Signed)
Please read and follow all provided instructions.  Your diagnoses today include:  1. Abrasion of left cornea, initial encounter   2. Abrasion of sclera of left eye, initial encounter     Tests performed today include:  Visual acuity testing to check your vision  Fluorescein dye examination to look for scratches on your eye  Vital signs. See below for your results today.   Medications prescribed:   Erythromycin  - antibiotic eye ointment  Use this medication as follows:  Apply 1/4" of the antibiotic ointment to affected eye up to 6 times a day while awake for 7 days  Take any prescribed medications only as directed.  Home care instructions:  Follow any educational materials contained in this packet.  You have a scratch of the eye on the cornea (the clear part of the eye). This condition may be caused by trauma. It is a common problem for people who wear contact lenses. Proper treatment is important. No evidence of infection is noted today, but you could develop an infection called a corneal ulcer or have some retained foreign body that may or may not have been noted today in the Emergency Department. Ulcers are not only painful, but they may also scar the cornea and cause permanent damage to the eye.   If you wear contact lenses, do not use them until your eye caregiver approves. Follow-up care is necessary to be sure the corneal abrasion is healing if not completely resolved in 2-3 days. See your caregiver or eye specialist as suggested for followup.   Follow-up instructions: Please follow-up with the opthalmologist listed in the next 2-3 days for further evaluation of your symptoms.   Return instructions:   Please return to the Emergency Department if you experience worsening symptoms.   Please return immediately if you develop severe pain, pus drainage, new change in vision, or fever.  Please return if you have any other emergent concerns.  Additional Information:  Your  vital signs today were: BP 126/86 (BP Location: Right Arm)   Pulse 85   Temp 98.4 F (36.9 C) (Oral)   Resp 18   Ht 6' (1.829 m)   Wt 69.4 kg   SpO2 98%   BMI 20.75 kg/m  If your blood pressure (BP) was elevated above 135/85 this visit, please have this repeated by your doctor within one month.

## 2020-06-08 NOTE — ED Triage Notes (Signed)
He feels like he has a piece of glass in his left eye. He was working on a car yesterday and the tail light broke.

## 2020-09-04 ENCOUNTER — Emergency Department (HOSPITAL_BASED_OUTPATIENT_CLINIC_OR_DEPARTMENT_OTHER)
Admission: EM | Admit: 2020-09-04 | Discharge: 2020-09-04 | Disposition: A | Discharge status: Home / Self Care | Payer: Medicaid Other | Attending: Emergency Medicine | Admitting: Emergency Medicine

## 2020-09-04 ENCOUNTER — Encounter (HOSPITAL_BASED_OUTPATIENT_CLINIC_OR_DEPARTMENT_OTHER): Payer: Self-pay | Admitting: Emergency Medicine

## 2020-09-04 DIAGNOSIS — W57XXXA Bitten or stung by nonvenomous insect and other nonvenomous arthropods, initial encounter: Secondary | ICD-10-CM | POA: Insufficient documentation

## 2020-09-04 DIAGNOSIS — S60460A Insect bite (nonvenomous) of right index finger, initial encounter: Secondary | ICD-10-CM | POA: Insufficient documentation

## 2020-09-04 DIAGNOSIS — Z5321 Procedure and treatment not carried out due to patient leaving prior to being seen by health care provider: Secondary | ICD-10-CM | POA: Insufficient documentation

## 2020-09-04 NOTE — ED Triage Notes (Signed)
Pt reports insect bite on right index finger 3 days ago. Pt has swelling and redness to the finger.

## 2020-09-05 ENCOUNTER — Encounter (HOSPITAL_BASED_OUTPATIENT_CLINIC_OR_DEPARTMENT_OTHER): Payer: Self-pay | Admitting: *Deleted

## 2020-09-05 ENCOUNTER — Other Ambulatory Visit: Payer: Self-pay

## 2020-09-05 ENCOUNTER — Emergency Department (HOSPITAL_BASED_OUTPATIENT_CLINIC_OR_DEPARTMENT_OTHER): Payer: Medicaid Other

## 2020-09-05 ENCOUNTER — Encounter (HOSPITAL_BASED_OUTPATIENT_CLINIC_OR_DEPARTMENT_OTHER): Payer: Self-pay

## 2020-09-05 ENCOUNTER — Emergency Department (HOSPITAL_BASED_OUTPATIENT_CLINIC_OR_DEPARTMENT_OTHER)
Admission: EM | Admit: 2020-09-05 | Discharge: 2020-09-05 | Discharge status: Against Medical Advice | Payer: Self-pay | Attending: Emergency Medicine | Admitting: Emergency Medicine

## 2020-09-05 ENCOUNTER — Emergency Department (HOSPITAL_BASED_OUTPATIENT_CLINIC_OR_DEPARTMENT_OTHER): Payer: Self-pay

## 2020-09-05 ENCOUNTER — Emergency Department (HOSPITAL_BASED_OUTPATIENT_CLINIC_OR_DEPARTMENT_OTHER)
Admission: EM | Admit: 2020-09-05 | Discharge: 2020-09-05 | Disposition: A | Discharge status: Home / Self Care | Payer: Medicaid Other | Attending: Emergency Medicine | Admitting: Emergency Medicine

## 2020-09-05 DIAGNOSIS — R2231 Localized swelling, mass and lump, right upper limb: Secondary | ICD-10-CM | POA: Insufficient documentation

## 2020-09-05 DIAGNOSIS — Z5321 Procedure and treatment not carried out due to patient leaving prior to being seen by health care provider: Secondary | ICD-10-CM | POA: Insufficient documentation

## 2020-09-05 DIAGNOSIS — L02511 Cutaneous abscess of right hand: Secondary | ICD-10-CM

## 2020-09-05 DIAGNOSIS — S60561A Insect bite (nonvenomous) of right hand, initial encounter: Secondary | ICD-10-CM | POA: Insufficient documentation

## 2020-09-05 DIAGNOSIS — W57XXXA Bitten or stung by nonvenomous insect and other nonvenomous arthropods, initial encounter: Secondary | ICD-10-CM | POA: Insufficient documentation

## 2020-09-05 DIAGNOSIS — F17210 Nicotine dependence, cigarettes, uncomplicated: Secondary | ICD-10-CM | POA: Insufficient documentation

## 2020-09-05 DIAGNOSIS — M79641 Pain in right hand: Secondary | ICD-10-CM | POA: Insufficient documentation

## 2020-09-05 LAB — COMPREHENSIVE METABOLIC PANEL
ALT: 42 U/L (ref 0–44)
AST: 37 U/L (ref 15–41)
Albumin: 4.2 g/dL (ref 3.5–5.0)
Alkaline Phosphatase: 78 U/L (ref 38–126)
Anion gap: 10 (ref 5–15)
BUN: 14 mg/dL (ref 6–20)
CO2: 26 mmol/L (ref 22–32)
Calcium: 8.9 mg/dL (ref 8.9–10.3)
Chloride: 100 mmol/L (ref 98–111)
Creatinine, Ser: 1.06 mg/dL (ref 0.61–1.24)
GFR, Estimated: >60 mL/min (ref >60)
Glucose, Bld: 115 mg/dL — ABNORMAL HIGH (ref 70–99)
Potassium: 4.1 mmol/L (ref 3.5–5.1)
Sodium: 136 mmol/L (ref 135–145)
Total Bilirubin: 0.9 mg/dL (ref 0.3–1.2)
Total Protein: 7.3 g/dL (ref 6.5–8.1)

## 2020-09-05 LAB — CBC WITH DIFFERENTIAL/PLATELET
Abs Immature Granulocytes: 0.06 10*3/uL (ref 0.00–0.07)
Basophils Absolute: 0.1 10*3/uL (ref 0.0–0.1)
Basophils Relative: 0 %
Eosinophils Absolute: 0.1 10*3/uL (ref 0.0–0.5)
Eosinophils Relative: 1 %
HCT: 39.8 % (ref 39.0–52.0)
Hemoglobin: 13.4 g/dL (ref 13.0–17.0)
Immature Granulocytes: 0 %
Lymphocytes Relative: 13 %
Lymphs Abs: 1.8 10*3/uL (ref 0.7–4.0)
MCH: 31 pg (ref 26.0–34.0)
MCHC: 33.7 g/dL (ref 30.0–36.0)
MCV: 92.1 fL (ref 80.0–100.0)
Monocytes Absolute: 1.1 10*3/uL — ABNORMAL HIGH (ref 0.1–1.0)
Monocytes Relative: 8 %
Neutro Abs: 10.7 10*3/uL — ABNORMAL HIGH (ref 1.7–7.7)
Neutrophils Relative %: 78 %
Platelets: 271 10*3/uL (ref 150–400)
RBC: 4.32 MIL/uL (ref 4.22–5.81)
RDW: 13.1 % (ref 11.5–15.5)
WBC: 13.8 10*3/uL — ABNORMAL HIGH (ref 4.0–10.5)
nRBC: 0 % (ref 0.0–0.2)

## 2020-09-05 LAB — LACTIC ACID, PLASMA: Lactic Acid, Venous: 1 mmol/L (ref 0.5–1.9)

## 2020-09-05 MED ORDER — VANCOMYCIN HCL IN DEXTROSE 1-5 GM/200ML-% IV SOLN: 1000.0000 mg | Freq: Once | INTRAVENOUS | Status: DC

## 2020-09-05 MED ORDER — VANCOMYCIN HCL 500 MG/100ML IV SOLN: 500.0000 mg | Freq: Once | INTRAVENOUS | Status: DC

## 2020-09-05 MED ORDER — VANCOMYCIN HCL 1500 MG/300ML IV SOLN
1500.0000 mg | Freq: Once | INTRAVENOUS | Status: DC
  Filled 2020-09-05: qty 300

## 2020-09-05 NOTE — ED Triage Notes (Signed)
Pt states he was bit by something when he was working on his car 4 days ago. Now has swelling and redness to hand and redness up arm, was here earlier waiting and left

## 2020-09-05 NOTE — ED Notes (Signed)
Per Registration staff PT LWBS. PT called multiple times over an hour and not visible in lobby at this time.

## 2020-09-05 NOTE — ED Notes (Signed)
Unable to locate pt in lobby or outside of ED

## 2020-09-05 NOTE — ED Notes (Signed)
Pt has not returned to dept.

## 2020-09-05 NOTE — ED Notes (Signed)
Pt walked to lobby to talk to family member.

## 2020-09-05 NOTE — ED Notes (Signed)
ED Provider at bedside. 

## 2020-09-05 NOTE — ED Provider Notes (Signed)
[facility] EMERGENCY DEPARTMENT Provider Note   CSN: [id number] Arrival date & time: 09/05/20  1648     History Chief Complaint  Patient presents with   Insect Bite    Zachary Anderson is a 35 y.o. male.  Patient complaining of swelling infection redness and pain to the right hand index finger.  Ongoing for about 4 days.  He think something bit him about 4 days ago he saw 2 small puncture marks.  This gradually is gotten worse over the course the last 4 days.  He states that the swelling and redness is tracking up to his arm.  Denies fevers or cough or chills.  No vomiting or diarrhea.  He smokes but no history of IV drug use.      History reviewed. No pertinent past medical history.  There are no problems to display for this patient.   History reviewed. No pertinent surgical history.     No family history on file.  Social History   Tobacco Use   Smoking status: Every Day    Types: Cigarettes   Smokeless tobacco: Never  Vaping Use   Vaping Use: Never used  Substance Use Topics   Alcohol use: Never   Drug use: Yes    Types: Marijuana    Home Medications Prior to Admission medications   Medication Sig Start Date End Date Taking? Authorizing Provider  acetaminophen (TYLENOL) 500 MG tablet Take 1,500 mg by mouth every 6 (six) hours as needed. For pain    [provider]  naproxen sodium (ANAPROX) 220 MG tablet Take 440 mg by mouth every 12 (twelve) hours as needed. For pain    [provider]    Allergies    Patient has no known allergies.  Review of Systems   Review of Systems  Constitutional:  Negative for fever.  HENT:  Negative for ear pain and sore throat.   Eyes:  Negative for pain.  Respiratory:  Negative for cough.   Cardiovascular:  Negative for chest pain.  Gastrointestinal:  Negative for abdominal pain.  Genitourinary:  Negative for flank pain.  Musculoskeletal:  Negative for back pain.  Skin:  Negative for color  change and rash.  Neurological:  Negative for syncope.  All other systems reviewed and are negative.  Physical Exam Updated Vital Signs BP 132/87 (BP Location: Left Arm)   Pulse 93   Temp 98.3 F (36.8 C) (Oral)   Resp 18   Ht 6' (1.829 m)   Wt 71.2 kg   SpO2 100%   BMI 21.29 kg/m   Physical Exam Constitutional:      Appearance: He is well-developed.  HENT:     Head: Normocephalic.     Nose: Nose normal.  Eyes:     Extraocular Movements: Extraocular movements intact.  Cardiovascular:     Rate and Rhythm: Normal rate.  Pulmonary:     Effort: Pulmonary effort is normal.  Musculoskeletal:     Comments: Right index finger swollen, fluctuance noted on the dorsal aspect of the finger.  Erythema and swelling extends to the base of the finger.  Otherwise intact flexion extension of the finger with moderate discomfort only.  Skin:    Coloration: Skin is not jaundiced.  Neurological:     Mental Status: He is alert. Mental status is at baseline.       ED Results / Procedures / Treatments   Labs (all labs ordered are listed, but only abnormal results are displayed) Labs  Reviewed  CULTURE, BLOOD (ROUTINE X 2)  CULTURE, BLOOD (ROUTINE X 2)  AEROBIC/ANAEROBIC CULTURE W GRAM STAIN (SURGICAL/DEEP WOUND)  CBC WITH DIFFERENTIAL/PLATELET  BASIC METABOLIC PANEL    EKG None  Radiology DG Hand Complete Right  Result Date: 09/05/2020 CLINICAL DATA:  Insect bite, redness, swelling, pain EXAM: RIGHT HAND - COMPLETE 3+ VIEW COMPARISON:  Hand radiographs 05/19/2011 FINDINGS: Screw fixation of the base of the index finger proximal phalanx is unchanged. There is no perihardware lucency. There is no acute fracture or dislocation. There is mild positive ulnar variance. Bony alignment is otherwise normal. The joint spaces are preserved. There is marked soft tissue swelling of the index finger. There is no soft tissue gas. There is no radiopaque foreign body. IMPRESSION: 1. Marked soft tissue  swelling of the index finger without underlying osseous abnormality. 2. No soft tissue gas or radiopaque foreign body. Electronically Signed   By: Lesia Hausen MD   On: 09/05/2020 11:43    Procedures Procedures   Medications Ordered in ED Medications  vancomycin (VANCOREADY) IVPB 1500 mg/300 mL (has no administration in time range)    ED Course  I have reviewed the triage vital signs and the nursing notes.  Pertinent labs & imaging results that were available during my care of the patient were reviewed by me and considered in my medical decision making (see chart for details).    MDM Rules/Calculators/A&P                           Case discussed with Dr. Izora Ribas from Hand surgery, will follow up on outpatient basis.  Labs are sent.  IV antibiotics ordered and cultures ordered.  However after had spoken with the hand surgeon and sent back into the patient's room he had left.  Nursing staff states that he went out to speak with somebody and just never came back.  Patient eloped prior to completion of evaluation.    Final Clinical Impression(s) / ED Diagnoses Final diagnoses:  None    Rx / DC Orders ED Discharge Orders     None        Cheryll Cockayne, MD 09/05/20 [phone]

## 2020-09-05 NOTE — ED Triage Notes (Signed)
Pt states he was bit by something when he was working on his car 4 days ago. Now has swelling and redness to hand and redness up arm.

## 2020-09-06 ENCOUNTER — Emergency Department (HOSPITAL_BASED_OUTPATIENT_CLINIC_OR_DEPARTMENT_OTHER)
Admission: EM | Admit: 2020-09-06 | Discharge: 2020-09-06 | Disposition: A | Discharge status: Home / Self Care | Payer: Medicaid Other | Attending: Emergency Medicine | Admitting: Emergency Medicine

## 2020-09-06 ENCOUNTER — Encounter (HOSPITAL_BASED_OUTPATIENT_CLINIC_OR_DEPARTMENT_OTHER): Payer: Self-pay | Admitting: Emergency Medicine

## 2020-09-06 DIAGNOSIS — F17210 Nicotine dependence, cigarettes, uncomplicated: Secondary | ICD-10-CM | POA: Insufficient documentation

## 2020-09-06 DIAGNOSIS — L089 Local infection of the skin and subcutaneous tissue, unspecified: Secondary | ICD-10-CM | POA: Insufficient documentation

## 2020-09-06 MED ORDER — SULFAMETHOXAZOLE-TRIMETHOPRIM 800-160 MG PO TABS: 1.0000 | ORAL_TABLET | Freq: Two times a day (BID) | ORAL | 0 refills | Status: AC

## 2020-09-06 MED ORDER — VANCOMYCIN HCL IN DEXTROSE 1-5 GM/200ML-% IV SOLN
1000.0000 mg | Freq: Once | INTRAVENOUS | Status: AC
  Administered 2020-09-06: 1000 mg via INTRAVENOUS
  Filled 2020-09-06: qty 200

## 2020-09-06 MED ORDER — CEPHALEXIN 500 MG PO CAPS: 500.0000 mg | ORAL_CAPSULE | Freq: Four times a day (QID) | ORAL | 0 refills | Status: DC

## 2020-09-06 NOTE — Discharge Instructions (Addendum)
You were seen today for infection of your finger.  It is very important that you take antibiotics and follow-up very closely later today with hand surgery.  If you show any signs or symptoms of your finger getting worse, you need to be reevaluated immediately.  Take antibiotics as prescribed.

## 2020-09-06 NOTE — ED Provider Notes (Signed)
[facility] EMERGENCY DEPARTMENT Provider Note   CSN: [id number] Arrival date & time: 09/06/20  0310     History Chief Complaint  Patient presents with   Cellulitis    ARIEN Anderson is a 35 y.o. male.  HPI     This is a 35 year old male who presents with an infection to the right second digit.  He has noted increasing redness, pain, drainage from the right index finger.  This is been ongoing for the last 4 days.  He believes something bit him several days ago.  He is right-handed.  He is also noted some redness in his arm.  He has not had any fevers.  No systemic symptoms.  Denies IV drug use.  Reports pain.  Rates his pain at 8 out of 10.  Of note, patient was seen and evaluated yesterday evening for the same.  I have reviewed his chart.  Lab work at that time showed a slight leukocytosis.  He was to have blood cultures and wound cultures drawn but eloped prior to that evaluation.  Based on documented conversation with hand surgery, plan was to give IV antibiotics and discharged with oral antibiotics and close outpatient hand follow-up.  When asked why he left, patient states that he had to go take care of his son.  History reviewed. No pertinent past medical history.  There are no problems to display for this patient.   History reviewed. No pertinent surgical history.     History reviewed. No pertinent family history.  Social History   Tobacco Use   Smoking status: Every Day    Types: Cigarettes   Smokeless tobacco: Never  Vaping Use   Vaping Use: Never used  Substance Use Topics   Alcohol use: Never   Drug use: Yes    Types: Marijuana    Home Medications Prior to Admission medications   Medication Sig Start Date End Date Taking? Authorizing Provider  cephALEXin (KEFLEX) 500 MG capsule Take 1 capsule (500 mg total) by mouth 4 (four) times daily. 09/06/20  Yes Ilay Capshaw, Mayer Masker, MD  sulfamethoxazole-trimethoprim (BACTRIM DS) 800-160 MG tablet Take 1  tablet by mouth 2 (two) times daily for 7 days. 09/06/20 09/13/20 Yes Conleigh Heinlein, Mayer Masker, MD  acetaminophen (TYLENOL) 500 MG tablet Take 1,500 mg by mouth every 6 (six) hours as needed. For pain    [provider]  naproxen sodium (ANAPROX) 220 MG tablet Take 440 mg by mouth every 12 (twelve) hours as needed. For pain    [provider]    Allergies    Patient has no known allergies.  Review of Systems   Review of Systems  Constitutional:  Negative for fever.  Respiratory:  Negative for shortness of breath.   Cardiovascular:  Negative for chest pain.  Skin:  Positive for color change and wound.  Neurological:  Negative for weakness and numbness.  All other systems reviewed and are negative.  Physical Exam Updated Vital Signs BP 135/85   Pulse 98   Temp 98.4 F (36.9 C) (Oral)   Resp 18   Ht 1.829 m (6')   Wt 71.2 kg   SpO2 97%   BMI 21.29 kg/m   Physical Exam Vitals and nursing note reviewed.  Constitutional:      Appearance: He is well-developed. He is not ill-appearing.  HENT:     Head: Normocephalic and atraumatic.     Nose: Nose normal.     Mouth/Throat:     Mouth: Mucous  membranes are moist.  Eyes:     Pupils: Pupils are equal, round, and reactive to light.  Cardiovascular:     Rate and Rhythm: Normal rate and regular rhythm.  Pulmonary:     Effort: Pulmonary effort is normal. No respiratory distress.  Abdominal:     Palpations: Abdomen is soft.  Musculoskeletal:     Cervical back: Neck supple.     Comments: Focused examination of the right hand with tense swelling noted of the second digit mostly over the dorsum of the hand with a desquamating wound, purulence noted, there is ecchymosis and erythema, limited extension but flexion intact, 2+ radial pulse, slight swelling noted over the dorsum of the hand, no streaking erythema up the arm See prior chart for images, mostly unchanged  Skin:    General: Skin is warm and dry.  Neurological:      Mental Status: He is alert and oriented to person, place, and time.    ED Results / Procedures / Treatments   Labs (all labs ordered are listed, but only abnormal results are displayed) Labs Reviewed  CULTURE, BLOOD (ROUTINE X 2)  CULTURE, BLOOD (ROUTINE X 2)  AEROBIC/ANAEROBIC CULTURE W GRAM STAIN (SURGICAL/DEEP WOUND)    EKG None  Radiology DG Hand Complete Right  Result Date: 09/05/2020 CLINICAL DATA:  Insect bite, redness, swelling, pain EXAM: RIGHT HAND - COMPLETE 3+ VIEW COMPARISON:  Hand radiographs 05/19/2011 FINDINGS: Screw fixation of the base of the index finger proximal phalanx is unchanged. There is no perihardware lucency. There is no acute fracture or dislocation. There is mild positive ulnar variance. Bony alignment is otherwise normal. The joint spaces are preserved. There is marked soft tissue swelling of the index finger. There is no soft tissue gas. There is no radiopaque foreign body. IMPRESSION: 1. Marked soft tissue swelling of the index finger without underlying osseous abnormality. 2. No soft tissue gas or radiopaque foreign body. Electronically Signed   By: Lesia Hausen MD   On: 09/05/2020 11:43    Procedures Procedures   Medications Ordered in ED Medications  vancomycin (VANCOCIN) IVPB 1000 mg/200 mL premix (1,000 mg Intravenous New Bag/Given 09/06/20 0422)    ED Course  I have reviewed the triage vital signs and the nursing notes.  Pertinent labs & imaging results that were available during my care of the patient were reviewed by me and considered in my medical decision making (see chart for details).    MDM Rules/Calculators/A&P                           Patient presents with a finger infection.  Was seen and evaluated yesterday evening for the same.  Labs were reviewed from yesterday morning.  Slight leukocytosis.  Otherwise no significant metabolic derangements.  I have added on blood cultures and wound cultures.  He did have an x-ray yesterday as  well that showed no evidence of osteomyelitis.  The origin of the infection appears to be on the dorsum of the hand and not on the flexor surface making flexor tenosynovitis less likely.  Suspect local infection with abscess and cellulitis.  It is actively draining.  Based on chart review from yesterday, plan was to give IV antibiotics and discharged with oral antibiotics and close hand surgery follow-up per discussion with hand surgery.  Patient prefers outpatient management if at all possible as he reports he has difficulty managing the care of his son otherwise.  He systemically well  appearing and afebrile.  Based on pictures in chart, wound appears stable from yesterday evening.  Patient was given IV vancomycin.  Will trial with Bactrim/Keflex on an outpatient basis.  I did stressed to the patient that it is extremely important that he call Dr. Debby Bud office this morning for close follow-up.  He understands that if he does not follow-up or take his antibiotics correctly, he could end up with worsening infection which could ultimately lead to amputation.  Patient stated understanding.  Final Clinical Impression(s) / ED Diagnoses Final diagnoses:  Finger infection    Rx / DC Orders ED Discharge Orders          Ordered    cephALEXin (KEFLEX) 500 MG capsule  4 times daily        09/06/20 0445    sulfamethoxazole-trimethoprim (BACTRIM DS) 800-160 MG tablet  2 times daily        09/06/20 0445             Rubin Dais, Mayer Masker, MD 09/06/20 [phone]

## 2020-09-06 NOTE — ED Triage Notes (Signed)
Pt has open wound to right index finger with redness and swelling to right hand.

## 2020-09-11 LAB — CULTURE, BLOOD (ROUTINE X 2)
Culture: NO GROWTH
Culture: NO GROWTH
Special Requests: ADEQUATE

## 2020-09-11 LAB — AEROBIC/ANAEROBIC CULTURE W GRAM STAIN (SURGICAL/DEEP WOUND): Gram Stain: NONE SEEN

## 2020-09-12 ENCOUNTER — Telehealth: Payer: Self-pay | Admitting: *Deleted

## 2020-09-12 NOTE — Telephone Encounter (Signed)
Post ED Visit - Positive Culture Follow-up: Unsuccessful Patient Follow-up  Culture assessed and recommendations reviewed by:  []  , Pharm.D. []  Enzo Bi, Pharm.D., BCPS AQ-ID []  , Pharm.D., BCPS []  Celedonio Miyamoto, Pharm.D., BCPS []  [place], Garvin Fila.D., BCPS, AAHIVP []  , Pharm.D., BCPS, AAHIVP []  Georgina Pillion, PharmD []  , PharmD, BCPS  Positive wound culture  []  Patient discharged without antimicrobial prescription and treatment is now indicated [x]  Organism is resistant to prescribed ED discharge antimicrobial []  Patient with positive blood cultures  Plan:  Stop Cephalexin and Stop Bactrim.  Start Doxycycline 100mg  PO BID x 7 days, [place], MD  Unable to contact patient after 3 attempts, letter will be sent to address on file  [address] 09/12/2020, 11:36 AM

## 2020-09-12 NOTE — Progress Notes (Signed)
ED Antimicrobial Stewardship Positive Culture Follow Up   Zachary Anderson is an 35 y.o. male who presented to [facility] on 09/06/2020 with a chief complaint of  Chief Complaint  Patient presents with   Cellulitis    Recent Results (from the past 720 hour(s))  Blood culture (routine x 2)     Status: None   Collection Time: 09/06/20  3:50 AM   Specimen: BLOOD LEFT ARM  Result Value Ref Range Status   Specimen Description   Final    BLOOD LEFT ARM Performed at [facility], [address] [place], [place] [address]    Special Requests   Final    BOTTLES DRAWN AEROBIC AND ANAEROBIC Blood Culture results may not be optimal due to an inadequate volume of blood received in culture bottles Performed at [facility], [address] [place], [place] [address]    Culture   Final    NO GROWTH 5 DAYS Performed at [facility], [address], [place], [place] [address]    Report Status 09/11/2020 FINAL  Final  Blood culture (routine x 2)     Status: None   Collection Time: 09/06/20  4:00 AM   Specimen: BLOOD  Result Value Ref Range Status   Specimen Description   Final    BLOOD LEFT ANTECUBITAL Performed at [facility], [address] [place], [place] [address]    Special Requests   Final    BOTTLES DRAWN AEROBIC AND ANAEROBIC Blood Culture adequate volume Performed at [facility], [address] [place], [place] [address]    Culture   Final    NO GROWTH 5 DAYS Performed at [facility], [address], [place], [place] [address]    Report Status 09/11/2020 FINAL  Final  Aerobic/Anaerobic Culture w Gram Stain (surgical/deep wound)     Status: None   Collection Time: 09/06/20  4:23 AM   Specimen: Abscess  Result Value Ref Range Status   Specimen Description   Final    ABSCESS FINGER RIGHT Performed at [facility], [address] [place], [place] [address]    Special Requests   Final    NONE Performed at  [facility], [address] [place], [place] [address]    Gram Stain NO WBC SEEN RARE GRAM POSITIVE COCCI   Final   Culture   Final    FEW METHICILLIN RESISTANT STAPHYLOCOCCUS AUREUS NO ANAEROBES ISOLATED Performed at [facility], [address] [place], [place] [address]    Report Status 09/11/2020 FINAL  Final   Organism ID, Bacteria METHICILLIN RESISTANT STAPHYLOCOCCUS AUREUS  Final      Susceptibility   Methicillin resistant staphylococcus aureus - MIC*    CIPROFLOXACIN >=8 RESISTANT Resistant     ERYTHROMYCIN >=8 RESISTANT Resistant     GENTAMICIN <=0.5 SENSITIVE Sensitive     OXACILLIN >=4 RESISTANT Resistant     TETRACYCLINE <=1 SENSITIVE Sensitive     VANCOMYCIN 1 SENSITIVE Sensitive     TRIMETH/SULFA >=320 RESISTANT Resistant     CLINDAMYCIN >=8 RESISTANT Resistant     RIFAMPIN <=0.5 SENSITIVE Sensitive     Inducible Clindamycin NEGATIVE Sensitive     * FEW METHICILLIN RESISTANT STAPHYLOCOCCUS AUREUS    [x]  Treated with cephalexin and Bactrim, organism resistant to prescribed antimicrobial   New antibiotic prescription: doxycycline 100mg  PO BID x7 days. Stop cephalexin  and Bactrim.  ED Provider: Gwyneth Sprout, MD   [name] 09/12/2020, 9:18 AM Clinical Pharmacist Monday - Friday phone -  [phone] Saturday - Sunday phone - [phone]

## 2021-02-22 ENCOUNTER — Telehealth: Payer: Self-pay | Admitting: *Deleted

## 2021-08-24 ENCOUNTER — Other Ambulatory Visit: Payer: Self-pay

## 2021-08-24 DIAGNOSIS — M79601 Pain in right arm: Secondary | ICD-10-CM | POA: Diagnosis present

## 2021-08-24 DIAGNOSIS — M5412 Radiculopathy, cervical region: Secondary | ICD-10-CM | POA: Diagnosis not present

## 2021-08-25 ENCOUNTER — Encounter (HOSPITAL_BASED_OUTPATIENT_CLINIC_OR_DEPARTMENT_OTHER): Payer: Self-pay | Admitting: Emergency Medicine

## 2021-08-25 ENCOUNTER — Emergency Department (HOSPITAL_BASED_OUTPATIENT_CLINIC_OR_DEPARTMENT_OTHER)
Admission: EM | Admit: 2021-08-25 | Discharge: 2021-08-25 | Disposition: A | Discharge status: Home / Self Care | Payer: Commercial Managed Care - HMO | Attending: Emergency Medicine | Admitting: Emergency Medicine

## 2021-08-25 ENCOUNTER — Other Ambulatory Visit: Payer: Self-pay

## 2021-08-25 DIAGNOSIS — M5410 Radiculopathy, site unspecified: Secondary | ICD-10-CM

## 2021-08-25 DIAGNOSIS — M79601 Pain in right arm: Secondary | ICD-10-CM

## 2021-08-25 MED ORDER — METHYLPREDNISOLONE 4 MG PO TBPK: ORAL_TABLET | ORAL | 0 refills | Status: DC

## 2021-08-25 NOTE — ED Provider Notes (Signed)
[facility] EMERGENCY DEPARTMENT Provider Note   CSN: [id number] Arrival date & time: 08/24/21  2351     History  Chief Complaint  Patient presents with   Arm Pain    Zachary Anderson is a 36 y.o. male.  The history is provided by the patient.  Arm Pain  Zachary Anderson is a 36 y.o. male who presents to the Emergency Department complaining of right arm pain.  He presents to the emergency department for evaluation of right arm pain that occurred after he sustained an injury between 18 and 24 months ago.  He states that he hurt it at work when [employer].  The pain has been off and on since the injury but it is been hurting worse lately and he was unable to work and pick up boxes due to worsening pain in the right arm.  Pain is predominantly around the elbow area but radiates up and down the arm involving the axillary area posterior upper arm as well as dorsal distal arm.  He gets intermittent numbness to the fourth and fifth digits for about 6 months.  No associated neck pain, chest pain.  He is right-hand dominant.  No fevers, vomiting, abdominal pain, weight loss.  He has always experienced night sweats and this is unchanged.  No known medical problems.  No alcohol or drug use.  He does smoke tobacco.         Home Medications Prior to Admission medications   Medication Sig Start Date End Date Taking? Authorizing Provider  methylPREDNISolone (MEDROL DOSEPAK) 4 MG TBPK tablet Take according label instructions 08/25/21  Yes Tilden Fossa, MD  acetaminophen (TYLENOL) 500 MG tablet Take 1,500 mg by mouth every 6 (six) hours as needed. For pain    [provider]  cephALEXin (KEFLEX) 500 MG capsule Take 1 capsule (500 mg total) by mouth 4 (four) times daily. 09/06/20   [place], Mayer Masker, MD  naproxen sodium (ANAPROX) 220 MG tablet Take 440 mg by mouth every 12 (twelve) hours as needed. For pain    [provider]      Allergies    Patient  has no known allergies.    Review of Systems   Review of Systems  All other systems reviewed and are negative.   Physical Exam Updated Vital Signs BP 122/65 (BP Location: Left Arm)   Pulse 60   Resp 20   Ht 6' (1.829 m)   Wt 71.7 kg   SpO2 95%   BMI 21.43 kg/m  Physical Exam Vitals and nursing note reviewed.  Constitutional:      Appearance: He is well-developed.  HENT:     Head: Normocephalic and atraumatic.  Cardiovascular:     Rate and Rhythm: Normal rate and regular rhythm.     Heart sounds: No murmur heard. Pulmonary:     Effort: Pulmonary effort is normal. No respiratory distress.     Breath sounds: Normal breath sounds.  Musculoskeletal:     Cervical back: Neck supple. No tenderness.     Comments: 2+ radial pulses bilaterally.  There is mild tenderness to palpation over the right olecranon process and bursa as well as the right axillary region without any deformities or palpable soft tissue swelling.  He is able to fully range the right upper extremity at the elbow, shoulder, wrist and digits.    Skin:    General: Skin is warm and dry.  Neurological:     Mental Status: He is  alert and oriented to person, place, and time.     Comments: 5 out of 5 grip strength in bilateral upper extremities, 5 out of 5 strength in proximal and distal bilateral upper extremities.  Sensation to light touch intact throughout bilateral upper extremities  Psychiatric:        Behavior: Behavior normal.     ED Results / Procedures / Treatments   Labs (all labs ordered are listed, but only abnormal results are displayed) Labs Reviewed - No data to display  EKG None  Radiology No results found.  Procedures Procedures    Medications Ordered in ED Medications - No data to display  ED Course/ Medical Decision Making/ A&P                           Medical Decision Making Risk Prescription drug management.   Patient here for evaluation of 1-1/2 to 2 years of right upper  extremity pain with intermittent paresthesias for 6 months.  He is neurologically and vascularly intact on evaluation with no evidence of acute infectious process or ischemia.  He appears to have a radiculopathy.  We will treat with steroid burst, OTC NSAIDs with orthopedics/PCP follow-up and return precautions.        Final Clinical Impression(s) / ED Diagnoses Final diagnoses:  Right arm pain  Radiculopathy, unspecified spinal region    Rx / DC Orders ED Discharge Orders          Ordered    methylPREDNISolone (MEDROL DOSEPAK) 4 MG TBPK tablet        08/25/21 0307              Tilden Fossa, MD 08/25/21 [phone]

## 2021-08-25 NOTE — ED Triage Notes (Signed)
R arm pain x 18 months. Today he "just couldn't take it anymore" and wanted to get it checked out.

## 2021-09-21 ENCOUNTER — Encounter (HOSPITAL_COMMUNITY): Payer: Self-pay

## 2021-09-21 ENCOUNTER — Emergency Department (HOSPITAL_COMMUNITY)
Admission: EM | Admit: 2021-09-21 | Discharge: 2021-09-21 | Disposition: A | Discharge status: Home / Self Care | Payer: Commercial Managed Care - HMO | Attending: Emergency Medicine | Admitting: Emergency Medicine

## 2021-09-21 ENCOUNTER — Emergency Department (HOSPITAL_COMMUNITY): Payer: Commercial Managed Care - HMO

## 2021-09-21 ENCOUNTER — Other Ambulatory Visit: Payer: Self-pay

## 2021-09-21 DIAGNOSIS — M25522 Pain in left elbow: Secondary | ICD-10-CM | POA: Insufficient documentation

## 2021-09-21 DIAGNOSIS — M7701 Medial epicondylitis, right elbow: Secondary | ICD-10-CM

## 2021-09-21 MED ORDER — DEXAMETHASONE SODIUM PHOSPHATE 10 MG/ML IJ SOLN: 10.0000 mg | Freq: Once | INTRAMUSCULAR | Status: DC

## 2021-09-21 MED ORDER — KETOROLAC TROMETHAMINE 60 MG/2ML IM SOLN
60.0000 mg | Freq: Once | INTRAMUSCULAR | Status: AC
  Administered 2021-09-21: 60 mg via INTRAMUSCULAR
  Filled 2021-09-21: qty 2

## 2021-09-21 MED ORDER — CELECOXIB 200 MG PO CAPS: 200.0000 mg | ORAL_CAPSULE | Freq: Two times a day (BID) | ORAL | 0 refills | Status: DC

## 2021-09-21 NOTE — ED Provider Notes (Signed)
[facility]-EMERGENCY DEPT Provider Note   CSN: [id number] Arrival date & time: 09/21/21  0137     History  Chief Complaint  Patient presents with   Elbow Pain    Zachary Anderson is a 36 y.o. male.  Who presents emergency department chief complaint of left elbow pain.  Patient states that 2 years ago he had an injury to the arm while stacking beams onto a pallet.  States that since that time he has had pain in his arm constantly and felt something snap at that time and has had some weakness.  Over the last 2 weeks he has had progressively worsening pain.  He states that his entire arm is aching but he is also noticed swelling in the left forearm down into the wrist and numbness and tingling in his right ring and pinky finger.  He has had decreased grip strength.  Pain is constant, worsening.  Unrelieved at home with OTC medications.  HPI     Home Medications Prior to Admission medications   Medication Sig Start Date End Date Taking? Authorizing Provider  acetaminophen (TYLENOL) 500 MG tablet Take 1,500 mg by mouth every 6 (six) hours as needed. For pain    [provider]  cephALEXin (KEFLEX) 500 MG capsule Take 1 capsule (500 mg total) by mouth 4 (four) times daily. 09/06/20   Horton, Mayer Masker, MD  methylPREDNISolone (MEDROL DOSEPAK) 4 MG TBPK tablet Take according label instructions 08/25/21   Tilden Fossa, MD  naproxen sodium (ANAPROX) 220 MG tablet Take 440 mg by mouth every 12 (twelve) hours as needed. For pain    [provider]      Allergies    Patient has no known allergies.    Review of Systems   Review of Systems  Physical Exam Updated Vital Signs BP (!) 130/94   Pulse 92   Temp 98.4 F (36.9 C)   Resp 18   Ht 6' (1.829 m)   Wt 81.6 kg   SpO2 98%   BMI 24.41 kg/m  Physical Exam Vitals and nursing note reviewed.  Constitutional:      General: He is not in acute distress.    Appearance: He is well-developed. He is  not diaphoretic.  HENT:     Head: Normocephalic and atraumatic.  Eyes:     General: No scleral icterus.    Conjunctiva/sclera: Conjunctivae normal.  Cardiovascular:     Rate and Rhythm: Normal rate and regular rhythm.     Heart sounds: Normal heart sounds.  Pulmonary:     Effort: Pulmonary effort is normal. No respiratory distress.     Breath sounds: Normal breath sounds.  Abdominal:     Palpations: Abdomen is soft.     Tenderness: There is no abdominal tenderness.  Musculoskeletal:     Cervical back: Normal range of motion and neck supple.     Comments: Patient with exquisite tenderness to the medial condyle.  He has decreased strength in the palm with adduction of the thumb to the pinky and ring finger.  There is swelling along the ulnar surface and along the dorsal wrist.  Negative Tinel's sign.  Skin:    General: Skin is warm and dry.  Neurological:     Mental Status: He is alert.  Psychiatric:        Behavior: Behavior normal.     ED Results / Procedures / Treatments   Labs (all labs ordered are listed, but only abnormal results are  displayed) Labs Reviewed - No data to display  EKG None  Radiology DG Elbow Complete Right  Result Date: 09/21/2021 CLINICAL DATA:  Right elbow pain for 2 weeks, no known injury, initial encounter EXAM: RIGHT ELBOW - COMPLETE 3+ VIEW COMPARISON:  None Available. FINDINGS: There is no evidence of fracture, dislocation, or joint effusion. There is no evidence of arthropathy or other focal bone abnormality. Soft tissues are unremarkable. IMPRESSION: No acute abnormality noted. Electronically Signed   By: Alcide Clever M.D.   On: 09/21/2021 02:28    Procedures Procedures    Medications Ordered in ED Medications - No data to display  ED Course/ Medical Decision Making/ A&P                           Medical Decision Making Amount and/or Complexity of Data Reviewed Radiology: ordered.  Patient here with severe right arm pain.  Suspect  medial epicondylitis.  I visualized and interpreted a right elbow x-ray which shows no acute findings.  Plan for conservative therapy with Ace wrap, anti-inflammatories, ice and close outpatient follow-up with orthopedics.  Patient appears otherwise appropriate for discharge at this time.        Final Clinical Impression(s) / ED Diagnoses Final diagnoses:  None    Rx / DC Orders ED Discharge Orders     None         Arthor Captain, PA-C 09/21/21 0322    Gilda Crease, MD 09/21/21 [phone]

## 2021-09-21 NOTE — ED Triage Notes (Signed)
Right elbow pain x 2 weeks. Nontraumatic. Sts injuring his elbows 2 years and always in pain.   Took ibuprofen and goody pta with no improvement. Has elbow brace in place.

## 2021-09-21 NOTE — Discharge Instructions (Addendum)
Contact a health care provider if: Your pain does not improve or it gets worse. You notice numbness in your hand. Get help right away if: Your pain is severe. You cannot move your wrist. 

## 2021-12-31 IMAGING — DX DG HAND COMPLETE 3+V*R*
3 series · 3 of 3 positions shown · non-contrast
Comparison: Hand radiographs 05/19/2011

CLINICAL DATA: Insect bite, redness, swelling, pain

EXAM:
RIGHT HAND - COMPLETE 3+ VIEW

[hand pa]
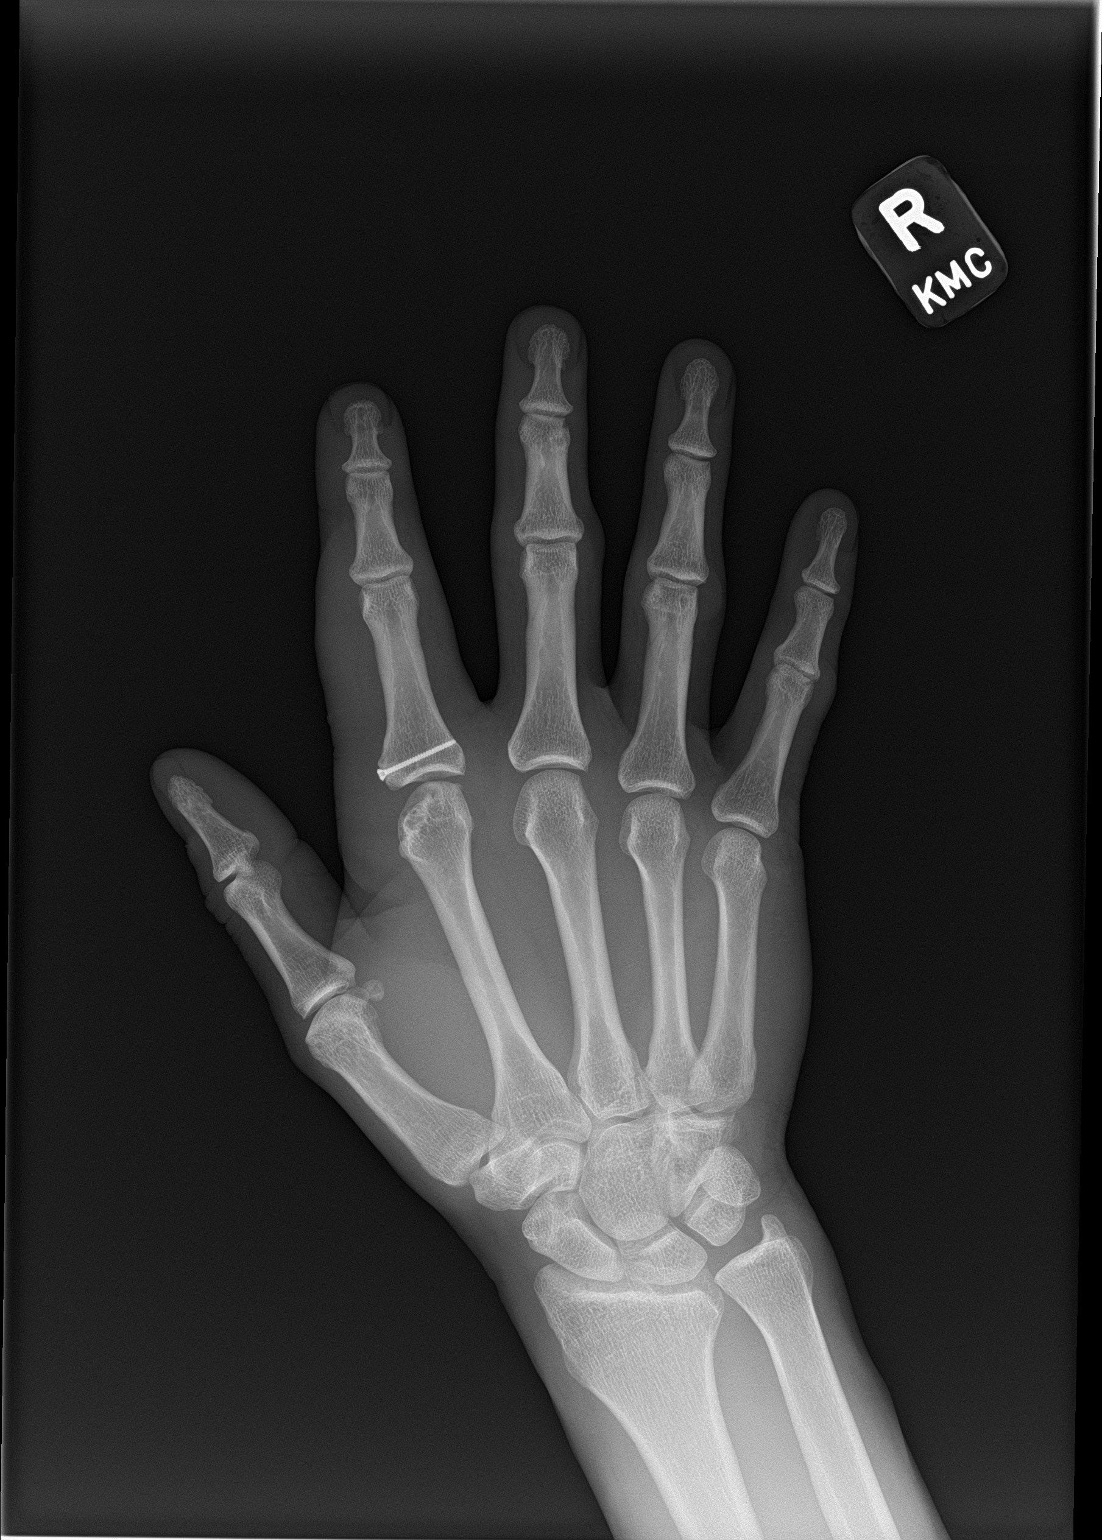

[hand obl]
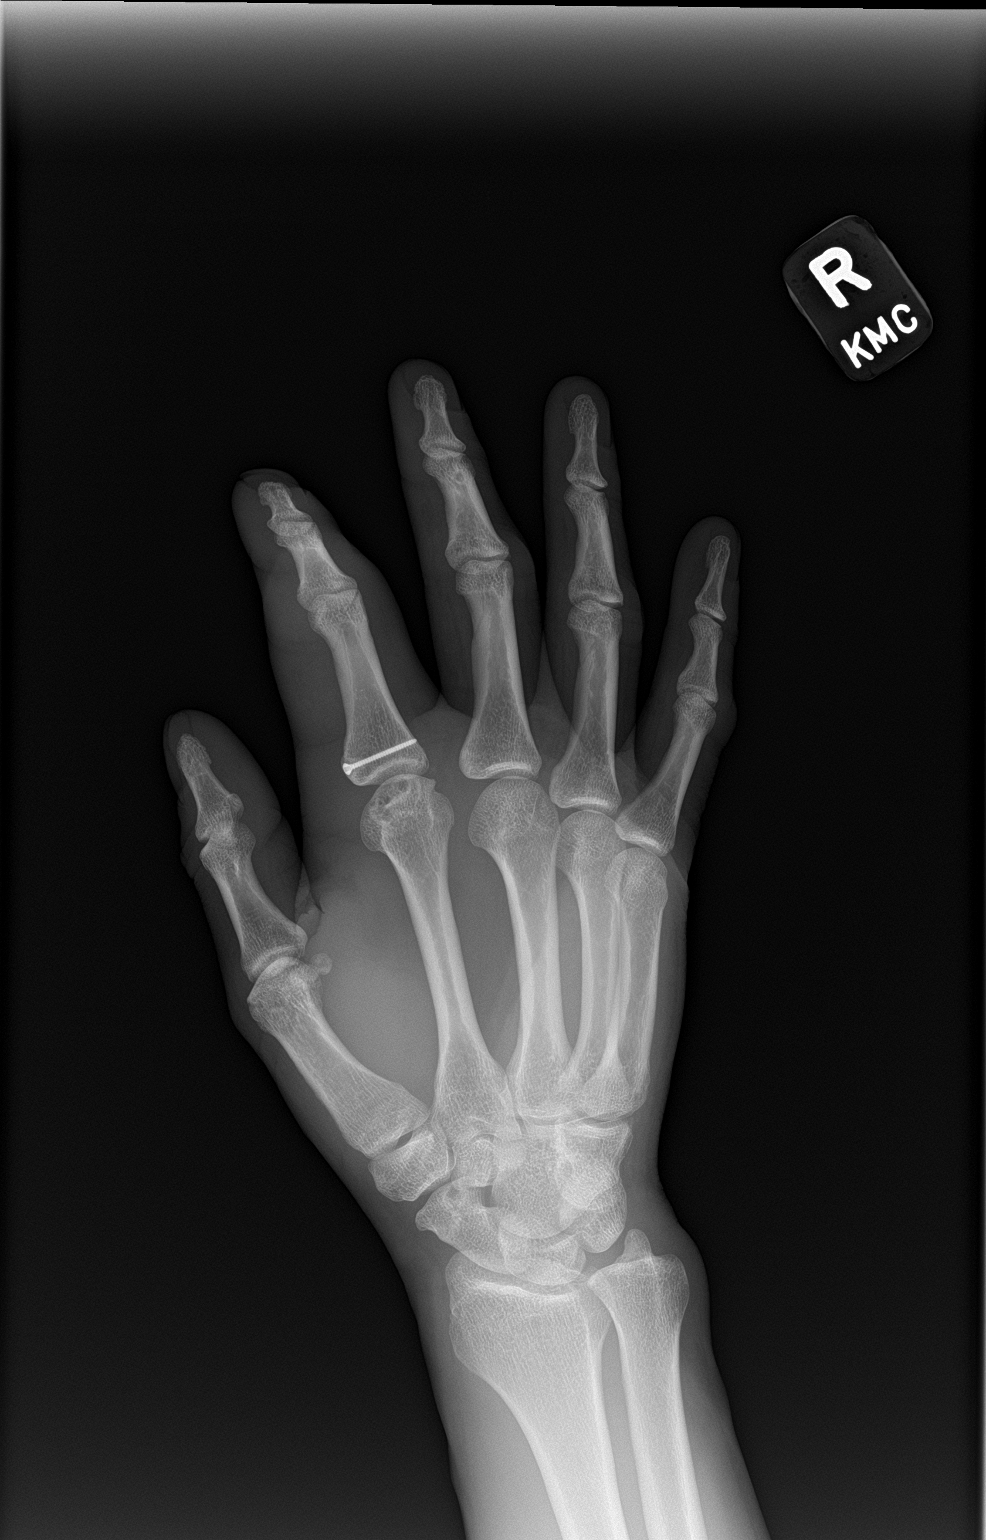

[hand lat]
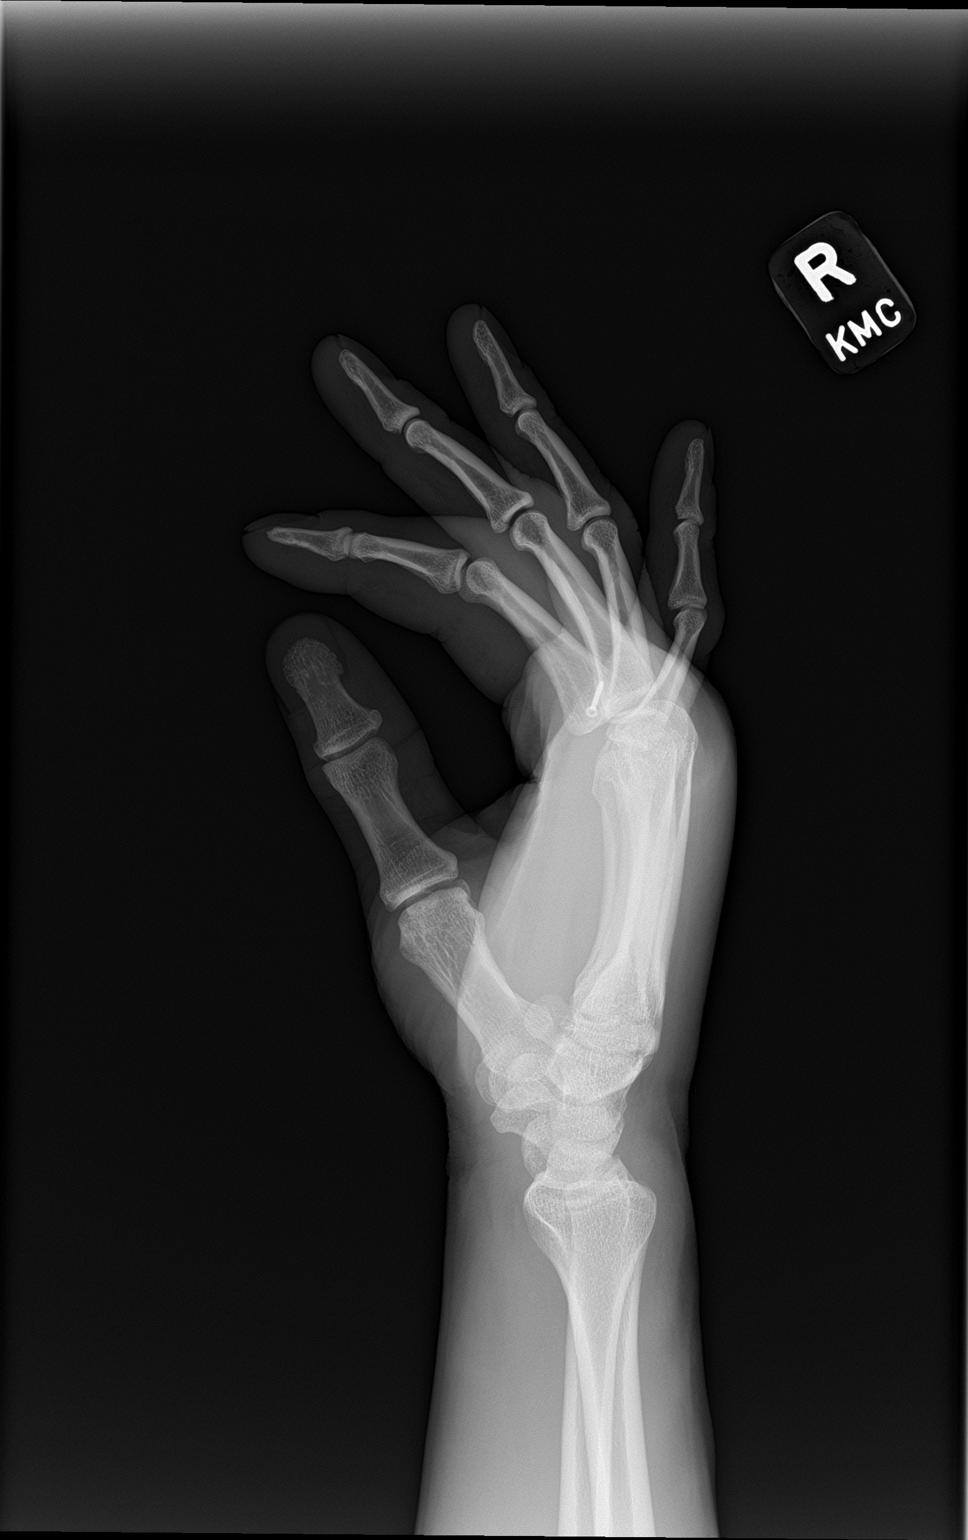

[3 of 3 positions shown; findings below may reference images not displayed]

FINDINGS: Screw fixation of the base of the index finger proximal phalanx is
unchanged. There is no perihardware lucency. There is no acute
fracture or dislocation. There is mild positive ulnar variance. Bony
alignment is otherwise normal. The joint spaces are preserved.

There is marked soft tissue swelling of the index finger. There is
no soft tissue gas. There is no radiopaque foreign body.
IMPRESSION: 1. Marked soft tissue swelling of the index finger without
underlying osseous abnormality.
2. No soft tissue gas or radiopaque foreign body.

## 2022-04-30 ENCOUNTER — Other Ambulatory Visit: Payer: Self-pay

## 2022-04-30 ENCOUNTER — Emergency Department (HOSPITAL_BASED_OUTPATIENT_CLINIC_OR_DEPARTMENT_OTHER)
Admission: EM | Admit: 2022-04-30 | Discharge: 2022-04-30 | Disposition: A | Discharge status: Home / Self Care | Payer: Medicaid Other | Attending: Emergency Medicine | Admitting: Emergency Medicine

## 2022-04-30 DIAGNOSIS — K029 Dental caries, unspecified: Secondary | ICD-10-CM | POA: Diagnosis not present

## 2022-04-30 DIAGNOSIS — R21 Rash and other nonspecific skin eruption: Secondary | ICD-10-CM | POA: Diagnosis present

## 2022-04-30 DIAGNOSIS — L659 Nonscarring hair loss, unspecified: Secondary | ICD-10-CM

## 2022-04-30 DIAGNOSIS — L658 Other specified nonscarring hair loss: Secondary | ICD-10-CM | POA: Diagnosis not present

## 2022-04-30 LAB — BASIC METABOLIC PANEL
Anion gap: 9 (ref 5–15)
BUN: 9 mg/dL (ref 6–20)
CO2: 28 mmol/L (ref 22–32)
Calcium: 9.1 mg/dL (ref 8.9–10.3)
Chloride: 100 mmol/L (ref 98–111)
Creatinine, Ser: 1.08 mg/dL (ref 0.61–1.24)
GFR, Estimated: >60 mL/min (ref >60)
Glucose, Bld: 126 mg/dL — ABNORMAL HIGH (ref 70–99)
Potassium: 4.6 mmol/L (ref 3.5–5.1)
Sodium: 137 mmol/L (ref 135–145)

## 2022-04-30 LAB — CBC WITH DIFFERENTIAL/PLATELET
Abs Immature Granulocytes: 0.03 10*3/uL (ref 0.00–0.07)
Basophils Absolute: 0.1 10*3/uL (ref 0.0–0.1)
Basophils Relative: 1 %
Eosinophils Absolute: 0.1 10*3/uL (ref 0.0–0.5)
Eosinophils Relative: 1 %
HCT: 46.1 % (ref 39.0–52.0)
Hemoglobin: 15.6 g/dL (ref 13.0–17.0)
Immature Granulocytes: 0 %
Lymphocytes Relative: 25 %
Lymphs Abs: 2.7 10*3/uL (ref 0.7–4.0)
MCH: 31 pg (ref 26.0–34.0)
MCHC: 33.8 g/dL (ref 30.0–36.0)
MCV: 91.7 fL (ref 80.0–100.0)
Monocytes Absolute: 0.7 10*3/uL (ref 0.1–1.0)
Monocytes Relative: 6 %
Neutro Abs: 7.4 10*3/uL (ref 1.7–7.7)
Neutrophils Relative %: 67 %
Platelets: 305 10*3/uL (ref 150–400)
RBC: 5.03 MIL/uL (ref 4.22–5.81)
RDW: 13.1 % (ref 11.5–15.5)
WBC: 10.9 10*3/uL — ABNORMAL HIGH (ref 4.0–10.5)
nRBC: 0 % (ref 0.0–0.2)

## 2022-04-30 MED ORDER — KETOCONAZOLE 2 % EX SHAM: 1.0000 | MEDICATED_SHAMPOO | CUTANEOUS | 0 refills | Status: AC

## 2022-04-30 MED ORDER — AMOXICILLIN-POT CLAVULANATE 875-125 MG PO TABS: 1.0000 | ORAL_TABLET | Freq: Two times a day (BID) | ORAL | 0 refills | Status: DC

## 2022-04-30 NOTE — ED Provider Notes (Signed)
  [place] [facility] [place] Provider Note   CSN: [id number] Arrival date & time: 04/30/22  1938     History {Add pertinent medical, surgical, social history, OB history to HPI:1} Chief Complaint  Patient presents with   Rash    Zachary Anderson is a 37 y.o. male.   Rash      Home Medications Prior to Admission medications   Medication Sig Start Date End Date Taking? Authorizing Provider  acetaminophen (TYLENOL) 500 MG tablet Take 1,500 mg by mouth every 6 (six) hours as needed. For pain    [provider]  celecoxib (CELEBREX) 200 MG capsule Take 1 capsule (200 mg total) by mouth 2 (two) times daily. 09/21/21   Margarita Mail, PA-C  cephALEXin (KEFLEX) 500 MG capsule Take 1 capsule (500 mg total) by mouth 4 (four) times daily. 09/06/20   Horton, Barbette Hair, MD  methylPREDNISolone (MEDROL DOSEPAK) 4 MG TBPK tablet Take according label instructions 08/25/21   Quintella Reichert, MD  naproxen sodium (ANAPROX) 220 MG tablet Take 440 mg by mouth every 12 (twelve) hours as needed. For pain    [provider]      Allergies    Patient has no known allergies.    Review of Systems   Review of Systems  Skin:  Positive for rash.    Physical Exam Updated Vital Signs BP 103/64   Pulse 88   Temp 98 F (36.7 C) (Oral)   Resp 19   Ht 6' (1.829 m)   Wt 68 kg   SpO2 98%   BMI 20.34 kg/m  Physical Exam  ED Results / Procedures / Treatments   Labs (all labs ordered are listed, but only abnormal results are displayed) Labs Reviewed  BASIC METABOLIC PANEL - Abnormal; Notable for the following components:      Result Value   Glucose, Bld 126 (*)    All other components within normal limits  CBC WITH DIFFERENTIAL/PLATELET - Abnormal; Notable for the following components:   WBC 10.9 (*)    All other components within normal limits    EKG None  Radiology No results found.  Procedures Procedures  {Document cardiac monitor,  telemetry assessment procedure when appropriate:1}  Medications Ordered in ED Medications - No data to display  ED Course/ Medical Decision Making/ A&P   {   Click here for ABCD2, HEART and other calculatorsREFRESH Note before signing :1}                          Medical Decision Making Amount and/or Complexity of Data Reviewed Labs: ordered.   ***  {Document critical care time when appropriate:1} {Document review of labs and clinical decision tools ie heart score, Chads2Vasc2 etc:1}  {Document your independent review of radiology images, and any outside records:1} {Document your discussion with family members, caretakers, and with consultants:1} {Document social determinants of health affecting pt's care:1} {Document your decision making why or why not admission, treatments were needed:1} Final Clinical Impression(s) / ED Diagnoses Final diagnoses:  None    Rx / DC Orders ED Discharge Orders     None

## 2022-04-30 NOTE — ED Triage Notes (Signed)
Patient coming to ED for evaluation of "a spot on my head."  Has a spot of hair loss and rash x 6 months.  States "it went away and then came back.  I think it is coming from my gum problems."  C/o fullness in ears.  No headaches or fevers.  Reports "something being off in my head."

## 2022-04-30 NOTE — Discharge Instructions (Addendum)
You were seen for alopecia or hair loss.  Is unclear the underlying cause but I do recommend you get a follow-up with dermatology for skin scraping to determine if this is truly ringworm infection.  I have given you a shampoo to start using to see if it helps with your symptoms.  You can take Benadryl over-the-counter as needed to help with itching.  I have also given you a course of antibiotics called Augmentin to help with dental caries.  You will need follow-up with one of the dentist loosening a resource guide to get evaluated for your dental issues.  Come back if any severe high fevers, severe pain, inability to swallow, or any other symptoms concerning to you

## 2023-08-09 DIAGNOSIS — L03116 Cellulitis of left lower limb: Secondary | ICD-10-CM | POA: Diagnosis not present

## 2023-08-09 DIAGNOSIS — S80262A Insect bite (nonvenomous), left knee, initial encounter: Secondary | ICD-10-CM | POA: Diagnosis not present

## 2023-08-10 ENCOUNTER — Encounter (HOSPITAL_BASED_OUTPATIENT_CLINIC_OR_DEPARTMENT_OTHER): Payer: Self-pay | Admitting: Emergency Medicine

## 2023-08-10 ENCOUNTER — Other Ambulatory Visit: Payer: Self-pay

## 2023-08-10 ENCOUNTER — Emergency Department (HOSPITAL_BASED_OUTPATIENT_CLINIC_OR_DEPARTMENT_OTHER)
Admission: EM | Admit: 2023-08-10 | Discharge: 2023-08-10 | Disposition: A | Discharge status: Home / Self Care | Attending: Emergency Medicine | Admitting: Emergency Medicine

## 2023-08-10 DIAGNOSIS — S80262A Insect bite (nonvenomous), left knee, initial encounter: Secondary | ICD-10-CM | POA: Diagnosis not present

## 2023-08-10 DIAGNOSIS — L03116 Cellulitis of left lower limb: Secondary | ICD-10-CM | POA: Insufficient documentation

## 2023-08-10 DIAGNOSIS — L539 Erythematous condition, unspecified: Secondary | ICD-10-CM | POA: Diagnosis present

## 2023-08-10 DIAGNOSIS — L0390 Cellulitis, unspecified: Secondary | ICD-10-CM

## 2023-08-10 MED ORDER — LIDOCAINE HCL (PF) 1 % IJ SOLN
5.0000 mL | Freq: Once | INTRAMUSCULAR | Status: AC
  Administered 2023-08-10: 5 mL
  Filled 2023-08-10: qty 5

## 2023-08-10 MED ORDER — CELECOXIB 200 MG PO CAPS: 200.0000 mg | ORAL_CAPSULE | Freq: Two times a day (BID) | ORAL | 0 refills | Status: DC

## 2023-08-10 MED ORDER — CEFTRIAXONE SODIUM 500 MG IJ SOLR
500.0000 mg | Freq: Once | INTRAMUSCULAR | Status: AC
  Administered 2023-08-10: 500 mg via INTRAMUSCULAR
  Filled 2023-08-10: qty 500

## 2023-08-10 MED ORDER — AMOXICILLIN-POT CLAVULANATE 875-125 MG PO TABS: 1.0000 | ORAL_TABLET | Freq: Two times a day (BID) | ORAL | 0 refills | Status: DC

## 2023-08-10 MED ORDER — ACETAMINOPHEN 500 MG PO TABS
1000.0000 mg | ORAL_TABLET | Freq: Once | ORAL | Status: DC
  Filled 2023-08-10: qty 2

## 2023-08-10 MED ORDER — IBUPROFEN 800 MG PO TABS
800.0000 mg | ORAL_TABLET | Freq: Once | ORAL | Status: DC
  Filled 2023-08-10: qty 1

## 2023-08-10 NOTE — ED Provider Notes (Signed)
 [place] EMERGENCY DEPARTMENT AT [facility] [place] Provider Note   CSN: [id number] Arrival date & time: 08/10/23  0147     History No chief complaint on file.   HPI DEMBA Zachary Anderson is a 38 y.o. male presenting for chief complaint of skin lesion.  States that he was cut by something at work or bit by a bug on his left anterior shin and knee joint. Happened approximate 3 days ago was seen at outside hospital but unable to be bedded before he had to leave for work. Comes in tonight with similar symptoms.  States that he tried supportive care but that the pain is becoming worse and the redness is running up his shin into his thigh.  Denies fevers chills nausea vomiting shortness of breath.  He is ambulatory tolerating p.o. intake on arrival.  He is able to bend his knee though he has some limp with ambulation..   Patient's recorded medical, surgical, social, medication list and allergies were reviewed in the Snapshot window as part of the initial history.   Review of Systems   Review of Systems  Constitutional:  Negative for chills and fever.  HENT:  Negative for ear pain and sore throat.   Eyes:  Negative for pain and visual disturbance.  Respiratory:  Negative for cough and shortness of breath.   Cardiovascular:  Negative for chest pain and palpitations.  Gastrointestinal:  Negative for abdominal pain and vomiting.  Genitourinary:  Negative for dysuria and hematuria.  Musculoskeletal:  Negative for arthralgias and back pain.  Skin:  Positive for rash. Negative for color change.  Neurological:  Negative for seizures and syncope.  All other systems reviewed and are negative.   Physical Exam Updated Vital Signs BP (!) 133/92   Pulse 75   Temp 98.2 F (36.8 C) (Oral)   Resp 20   Ht 6' (1.829 m)   Wt 64.4 kg   SpO2 97%   BMI 19.26 kg/m  Physical Exam Vitals and nursing note reviewed.  Constitutional:      General: He is not in acute distress.    Appearance: He is  well-developed.  HENT:     Head: Normocephalic and atraumatic.  Eyes:     Conjunctiva/sclera: Conjunctivae normal.  Cardiovascular:     Rate and Rhythm: Normal rate and regular rhythm.     Heart sounds: No murmur heard. Pulmonary:     Effort: Pulmonary effort is normal. No respiratory distress.     Breath sounds: Normal breath sounds.  Abdominal:     Palpations: Abdomen is soft.     Tenderness: There is no abdominal tenderness.  Musculoskeletal:        General: No swelling.     Cervical back: Neck supple.  Skin:    General: Skin is warm and dry.     Capillary Refill: Capillary refill takes less than 2 seconds.     Findings: Lesion (Erythematous nodule to left anterior shin.  No palpable fluctuance.  Streaking erythematous from the space up the left medial thigh to the level of the mid thigh.) present.  Neurological:     Mental Status: He is alert.  Psychiatric:        Mood and Affect: Mood normal.      ED Course/ Medical Decision Making/ A&P    Procedures .Ultrasound ED Soft Tissue  Date/Time: 08/10/2023 2:19 AM  Performed by: Jerral Meth, MD Authorized by: Jerral Meth, MD   Procedure details:    Indications: localization of  abscess and evaluate for cellulitis     Transverse view:  Visualized   Longitudinal view:  Visualized   Images: archived   Location:    Location: lower extremity     Side:  Left Findings:     no abscess present    cellulitis present    Medications Ordered in ED Medications  cefTRIAXone  (ROCEPHIN ) injection 500 mg (has no administration in time range)  acetaminophen  (TYLENOL ) tablet 1,000 mg (has no administration in time range)  ibuprofen  (ADVIL ) tablet 800 mg (has no administration in time range)   Medical Decision Making:   JASER FULLEN is a 38 y.o. male who presented to the ED today with skin redness and irritation detailed above.    Patient placed on continuous vitals and telemetry monitoring while in ED which was  reviewed periodically.  Complete initial physical exam performed, notably the patient  was HDS in NAD. Patient has a 5cm circumscribed area of erythema to their left leg. No gross fluctuance.     Reviewed and confirmed nursing documentation for past medical history, family history, social history.    Initial Assessment:   With the patient's presentation of skin irritation, most likely diagnosis is cellulitis. Other diagnoses were considered including (but not limited to) abscess/nec. Fasc/ systemic infection. These are considered less likely due to history of present illness and physical exam findings.   This is most consistent with an acute life/limb threatening illness complicated by underlying chronic conditions. Lack of systemic symptoms including fever/tachycardia makes severe infection less likely Patient can ambulate successfully making septic arthritis grossly inconsistent Initial Plan:  POCUS Soft tissue to eval for abscess. Detailed above Recommended screening labs including CBC and Metabolic panel to evaluate for infectious or metabolic etiology of disease Recommended IV Rocephin  due to developing lymphangitis or more aggressive management of symptoms.   Unfortunately, patient states that he has to leave for work as soon as possible and does not want IV placement today.  He did consent to IM administration of Rocephin  for more aggressive management of his symptoms as well as transition to p.o. antibiotics in 24 hours for ongoing care and management in the outpatient setting. We discussed that this limits my ability to read stratify him for more severe infection and he expressed understanding but stated that he had to leave soon. Will treat his symptoms with NSAIDs/Tylenol , IM Rocephin  for infection management and recommend he return to his PCP within 24 hours for ongoing evaluation, management or return to emergency department at that time if unable to follow-up with PCP.  Clinical  Impression:  1. Cellulitis, unspecified cellulitis site      Data Unavailable   Final Clinical Impression(s) / ED Diagnoses Final diagnoses:  Cellulitis, unspecified cellulitis site    Rx / DC Orders ED Discharge Orders          Ordered    celecoxib  (CELEBREX ) 200 MG capsule  2 times daily        08/10/23 0217    celecoxib  (CELEBREX ) 200 MG capsule  2 times daily        08/10/23 0217    amoxicillin -clavulanate (AUGMENTIN ) 875-125 MG tablet  Every 12 hours        08/10/23 0217              Jerral Meth, MD 08/10/23 0221

## 2023-08-10 NOTE — ED Triage Notes (Addendum)
 Pt presents with an ?insect bite to L knee since Friday 7/11. Knee is red with a dark area in the middle and redness that tracks up his inner thigh. Pt believes this to be a spider bite, but did not see a spider. He walks unassisted with a heavy limp.

## 2023-08-19 ENCOUNTER — Encounter (HOSPITAL_BASED_OUTPATIENT_CLINIC_OR_DEPARTMENT_OTHER): Payer: Self-pay

## 2023-08-19 ENCOUNTER — Other Ambulatory Visit: Payer: Self-pay

## 2023-08-19 DIAGNOSIS — Z5321 Procedure and treatment not carried out due to patient leaving prior to being seen by health care provider: Secondary | ICD-10-CM | POA: Diagnosis not present

## 2023-08-19 DIAGNOSIS — M25562 Pain in left knee: Secondary | ICD-10-CM | POA: Insufficient documentation

## 2023-08-19 DIAGNOSIS — Z5189 Encounter for other specified aftercare: Secondary | ICD-10-CM | POA: Diagnosis not present

## 2023-08-19 NOTE — ED Triage Notes (Signed)
 Pt was seen almost 2 weeks for a spider bite.  Has missed a few days of his antibiotic and feels it is getting worse

## 2023-08-20 ENCOUNTER — Emergency Department (HOSPITAL_BASED_OUTPATIENT_CLINIC_OR_DEPARTMENT_OTHER)
Admission: EM | Admit: 2023-08-20 | Discharge: 2023-08-20 | Discharge status: Against Medical Advice | Attending: Emergency Medicine | Admitting: Emergency Medicine

## 2024-02-09 ENCOUNTER — Other Ambulatory Visit: Payer: Self-pay

## 2024-02-09 DIAGNOSIS — W228XXA Striking against or struck by other objects, initial encounter: Secondary | ICD-10-CM | POA: Insufficient documentation

## 2024-02-09 DIAGNOSIS — S9031XA Contusion of right foot, initial encounter: Secondary | ICD-10-CM | POA: Insufficient documentation

## 2024-02-10 ENCOUNTER — Encounter (HOSPITAL_BASED_OUTPATIENT_CLINIC_OR_DEPARTMENT_OTHER): Payer: Self-pay

## 2024-02-10 ENCOUNTER — Emergency Department (HOSPITAL_BASED_OUTPATIENT_CLINIC_OR_DEPARTMENT_OTHER)
Admission: EM | Admit: 2024-02-10 | Discharge: 2024-02-10 | Disposition: A | Payer: Self-pay | Attending: Emergency Medicine | Admitting: Emergency Medicine

## 2024-02-10 ENCOUNTER — Emergency Department (HOSPITAL_BASED_OUTPATIENT_CLINIC_OR_DEPARTMENT_OTHER): Payer: Self-pay

## 2024-02-10 ENCOUNTER — Other Ambulatory Visit: Payer: Self-pay

## 2024-02-10 DIAGNOSIS — S9031XA Contusion of right foot, initial encounter: Secondary | ICD-10-CM

## 2024-02-10 MED ORDER — IBUPROFEN 400 MG PO TABS
600.0000 mg | ORAL_TABLET | Freq: Once | ORAL | Status: AC
  Administered 2024-02-10: 600 mg via ORAL
  Filled 2024-02-10: qty 1

## 2024-02-10 MED ORDER — IBUPROFEN 600 MG PO TABS: 600.0000 mg | ORAL_TABLET | Freq: Four times a day (QID) | ORAL | 0 refills | Status: AC | PRN

## 2024-02-10 NOTE — ED Provider Notes (Signed)
" ° °  [place] EMERGENCY DEPARTMENT AT [facility] [place]  Provider Note  CSN: [id number] Arrival date & time: 02/09/24 2355  History Chief Complaint  Patient presents with   Foot Injury    Zachary Anderson is a 39 y.o. male reports he kicked a bumper with his R foot 2 days ago. Now having pain across the top of his foot, worse with movement.    Home Medications Prior to Admission medications  Medication Sig Start Date End Date Taking? Authorizing Provider  ibuprofen  (ADVIL ) 600 MG tablet Take 1 tablet (600 mg total) by mouth every 6 (six) hours as needed. 02/10/24  Yes Roselyn Carlin NOVAK, MD  acetaminophen  (TYLENOL ) 500 MG tablet Take 1,500 mg by mouth every 6 (six) hours as needed. For pain    [provider]  ketoconazole  (NIZORAL ) 2 % shampoo Apply 1 Application topically 2 (two) times a week. 05/01/22   Ethyl Richerd BROCKS, MD     Allergies    Patient has no known allergies.   Review of Systems   Review of Systems Please see HPI for pertinent positives and negatives  Physical Exam BP (!) 122/107   Pulse 89   Temp 97.9 F (36.6 C)   Resp 15   Ht 6' (1.829 m)   Wt 70.3 kg   SpO2 99%   BMI 21.02 kg/m   Physical Exam Vitals and nursing note reviewed.  HENT:     Head: Normocephalic.     Nose: Nose normal.  Eyes:     Extraocular Movements: Extraocular movements intact.  Pulmonary:     Effort: Pulmonary effort is normal.  Musculoskeletal:        General: Tenderness (dorsal right foot) present. No swelling or deformity. Normal range of motion.     Cervical back: Neck supple.  Skin:    Findings: No rash (on exposed skin).  Neurological:     Mental Status: He is alert and oriented to person, place, and time.  Psychiatric:        Mood and Affect: Mood normal.     ED Results / Procedures / Treatments   EKG None  Procedures Procedures  Medications Ordered in the ED Medications  ibuprofen  (ADVIL ) tablet 600 mg (has no administration in time  range)    Initial Impression and Plan  Patient here with R foot pain after injury. Exam is reassuring. I personally viewed the images from radiology studies and agree with radiologist interpretation: Xray is neg for fracture. Will place in cam boot for comfort. Recommend ice, elevation, NSAIDs and PCP follow up, RTED for any other concerns.    ED Course       MDM Rules/Calculators/A&P Medical Decision Making Problems Addressed: Contusion of right foot, initial encounter: acute illness or injury  Amount and/or Complexity of Data Reviewed Radiology: ordered and independent interpretation performed. Decision-making details documented in ED Course.  Risk Prescription drug management.     Final Clinical Impression(s) / ED Diagnoses Final diagnoses:  Contusion of right foot, initial encounter    Rx / DC Orders ED Discharge Orders          Ordered    ibuprofen  (ADVIL ) 600 MG tablet  Every 6 hours PRN        02/10/24 0107             Roselyn Carlin NOVAK, MD 02/10/24 0107  "

## 2024-02-10 NOTE — ED Triage Notes (Signed)
 Pt reports he kicked a car bumper yesterday and now has swelling to his right foot. + sensation, + pedal pulse. He is ambulatory with independent steady gait.

## 2024-02-22 ENCOUNTER — Encounter (HOSPITAL_BASED_OUTPATIENT_CLINIC_OR_DEPARTMENT_OTHER): Payer: Self-pay

## 2024-02-22 ENCOUNTER — Other Ambulatory Visit: Payer: Self-pay

## 2024-02-22 ENCOUNTER — Emergency Department (HOSPITAL_BASED_OUTPATIENT_CLINIC_OR_DEPARTMENT_OTHER)
Admission: EM | Admit: 2024-02-22 | Discharge: 2024-02-22 | Disposition: A | Discharge status: Home / Self Care | Payer: Self-pay

## 2024-02-22 DIAGNOSIS — K130 Diseases of lips: Secondary | ICD-10-CM | POA: Insufficient documentation

## 2024-02-22 DIAGNOSIS — Z202 Contact with and (suspected) exposure to infections with a predominantly sexual mode of transmission: Secondary | ICD-10-CM | POA: Insufficient documentation

## 2024-02-22 MED ORDER — MUPIROCIN CALCIUM 2 % EX CREA: 1.0000 | TOPICAL_CREAM | Freq: Two times a day (BID) | CUTANEOUS | 0 refills | Status: AC

## 2024-02-22 MED ORDER — LIDOCAINE HCL (PF) 1 % IJ SOLN
INTRAMUSCULAR | Status: AC
  Administered 2024-02-22: 1 mL
  Filled 2024-02-22: qty 5

## 2024-02-22 MED ORDER — DOXYCYCLINE HYCLATE 100 MG PO CAPS: 100.0000 mg | ORAL_CAPSULE | Freq: Two times a day (BID) | ORAL | 0 refills | Status: AC

## 2024-02-22 MED ORDER — DOXYCYCLINE HYCLATE 100 MG PO TABS
100.0000 mg | ORAL_TABLET | Freq: Once | ORAL | Status: AC
  Administered 2024-02-22: 100 mg via ORAL
  Filled 2024-02-22: qty 1

## 2024-02-22 MED ORDER — METRONIDAZOLE 500 MG PO TABS
2000.0000 mg | ORAL_TABLET | Freq: Once | ORAL | Status: AC
  Administered 2024-02-22: 2000 mg via ORAL
  Filled 2024-02-22: qty 4

## 2024-02-22 MED ORDER — CEFTRIAXONE SODIUM 500 MG IJ SOLR
500.0000 mg | Freq: Once | INTRAMUSCULAR | Status: AC
  Administered 2024-02-22: 500 mg via INTRAMUSCULAR
  Filled 2024-02-22: qty 500

## 2024-02-22 NOTE — ED Triage Notes (Signed)
 Reports insect bite to R side of upper lip 2 days ago. Pus drainage. Denies fevers  Denies difficulty breathing or swallowing.   Also reports exposure to chlamydia, requesting STD testing

## 2024-02-22 NOTE — ED Provider Notes (Signed)
 " [place] EMERGENCY DEPARTMENT AT [facility] [place] Provider Note   CSN: [id number] Arrival date & time: 02/22/24  1449     Patient presents with: Insect Bite and Exposure to STD   Zachary Anderson is a 39 y.o. male.  Patient is a 39 year old male who presents to the ED for a potential insect bite to the right upper lip for the past 2 days.  He also notes exposure to committee and trichomonas.  Patient notes the right upper lip has increased in swelling and has been draining for the past 2 days.  He was working outside and believes he may have been bitten by a spider.  Notes tetanus is up-to-date.  Denies any fevers or difficulty breathing.  He states he has also had penile discharge for the past 2 days.  Denies scrotal pain or swelling.  He notes his current partner told him she does a positive for chlamydia and trichomonas.  Denies fevers, nausea/vomiting, abdominal pain.  No further complaints.    Exposure to STD       Prior to Admission medications  Medication Sig Start Date End Date Taking? Authorizing Provider  acetaminophen  (TYLENOL ) 500 MG tablet Take 1,500 mg by mouth every 6 (six) hours as needed. For pain    [provider]  ibuprofen  (ADVIL ) 600 MG tablet Take 1 tablet (600 mg total) by mouth every 6 (six) hours as needed. 02/10/24   Roselyn Carlin NOVAK, MD  ketoconazole  (NIZORAL ) 2 % shampoo Apply 1 Application topically 2 (two) times a week. 05/01/22   Ethyl Richerd BROCKS, MD    Allergies: Patient has no known allergies.    Review of Systems  Constitutional:  Negative for chills and fever.  Genitourinary:  Positive for penile discharge. Negative for dysuria, penile pain, penile swelling and scrotal swelling.  Skin:  Positive for wound.  All other systems reviewed and are negative.   Updated Vital Signs BP (!) 133/98 (BP Location: Right Arm)   Pulse (!) 104   Temp 98.7 F (37.1 C) (Oral)   Resp 18   SpO2 98%   Physical Exam Constitutional:       Appearance: Normal appearance.  HENT:     Head: Normocephalic.     Nose: Nose normal.     Mouth/Throat:     Mouth: Mucous membranes are moist.     Comments: Right upper outer lip has a small purulent wound.  No active drainage.  There is surrounding signs of cellulitis and edema.  Posterior oropharynx clear with no difficulty breathing. Abdominal:     General: Bowel sounds are normal.     Palpations: Abdomen is soft.     Tenderness: There is no abdominal tenderness.  Neurological:     Mental Status: He is alert and oriented to person, place, and time.  Psychiatric:        Mood and Affect: Mood normal.        Behavior: Behavior normal.     (all labs ordered are listed, but only abnormal results are displayed) Labs Reviewed  GC/CHLAMYDIA PROBE AMP ([place]) NOT AT [facility]    EKG: None  Radiology: No results found.    Medications Ordered in the ED  cefTRIAXone  (ROCEPHIN ) injection 500 mg (has no administration in time range)  doxycycline  (VIBRA -TABS) tablet 100 mg (has no administration in time range)  metroNIDAZOLE  (FLAGYL ) tablet 2,000 mg (has no administration in time range)  Medical Decision Making Patient is a 39 year old male who presents to the ED for a potential insect bite to the upper lip as well as requesting STI testing.  Please see detailed HPI above.  On exam patient is alert and in no acute distress.  Physical exam as noted above.  He does have a small area of purulence with surrounding signs of cellulitis of the right upper lip but no acute respiratory distress.  Afebrile.  Also notes known exposure to chlamydia and trichomonas.  Will treat patient empirically for gonorrhea as well.  No concerns or further emergent workup at this time as he is afebrile and vital signs are otherwise stable.  Stable for discharge home.  Patient given Rocephin , Flagyl , versus a doxycycline  while in ED.  Prescribe doxycycline .  Symptomatic and  social instructions provided.  Advised to follow-up with health department in 4 to 6 weeks to ensure resolution.  He understands plan is agreeable.  Return precautions provided.  Risk Prescription drug management.      Final diagnoses:  None    ED Discharge Orders     None          Neysa Thersia GORMAN DEVONNA 02/22/24 1720    Ula Prentice SAUNDERS, MD 02/22/24 2251  "

## 2024-02-22 NOTE — Discharge Instructions (Signed)
 Begin taking doxycycline  as prescribed.  May use mupirocin  ointment on the outer lip twice daily for the next 7 days to help with infected spot as well.  No intercourse for the next 10 days as you are contagious.  Please follow-up with health department in 4 to 6 weeks to ensure resolution of treatment as well as for further STI testing is needed.  Return to ED if any symptoms worsen including new fevers, difficulty breathing, severe abdominal pain, severe scrotal pain/swelling.

## 2024-02-23 LAB — GC/CHLAMYDIA PROBE AMP (~~LOC~~) NOT AT ARMC
Chlamydia: NEGATIVE
Comment: NEGATIVE
Comment: NORMAL
Neisseria Gonorrhea: POSITIVE — AB

## 2024-02-25 ENCOUNTER — Ambulatory Visit (HOSPITAL_COMMUNITY): Payer: Self-pay

## 2024-03-01 ENCOUNTER — Other Ambulatory Visit: Payer: Self-pay

## 2024-03-01 ENCOUNTER — Encounter (HOSPITAL_BASED_OUTPATIENT_CLINIC_OR_DEPARTMENT_OTHER): Payer: Self-pay | Admitting: Emergency Medicine

## 2024-03-01 ENCOUNTER — Emergency Department (HOSPITAL_BASED_OUTPATIENT_CLINIC_OR_DEPARTMENT_OTHER)
Admission: EM | Admit: 2024-03-01 | Discharge: 2024-03-01 | Disposition: A | Discharge status: Home / Self Care | Payer: Self-pay | Attending: Emergency Medicine | Admitting: Emergency Medicine

## 2024-03-01 DIAGNOSIS — S0086XA Insect bite (nonvenomous) of other part of head, initial encounter: Secondary | ICD-10-CM | POA: Insufficient documentation

## 2024-03-01 DIAGNOSIS — W57XXXA Bitten or stung by nonvenomous insect and other nonvenomous arthropods, initial encounter: Secondary | ICD-10-CM

## 2024-03-01 DIAGNOSIS — A549 Gonococcal infection, unspecified: Secondary | ICD-10-CM

## 2024-03-01 MED ORDER — DOXYCYCLINE HYCLATE 100 MG PO CAPS: 100.0000 mg | ORAL_CAPSULE | Freq: Two times a day (BID) | ORAL | 0 refills | Status: AC

## 2024-03-01 MED ORDER — MUPIROCIN CALCIUM 2 % EX CREA: 1.0000 | TOPICAL_CREAM | Freq: Two times a day (BID) | CUTANEOUS | 0 refills | Status: AC

## 2024-03-01 MED ORDER — LORATADINE 10 MG PO TABS
10.0000 mg | ORAL_TABLET | Freq: Once | ORAL | Status: AC
  Administered 2024-03-01: 10 mg via ORAL
  Filled 2024-03-01: qty 1

## 2024-03-01 NOTE — Discharge Instructions (Addendum)
Start taking claritin daily

## 2024-03-03 ENCOUNTER — Emergency Department (HOSPITAL_BASED_OUTPATIENT_CLINIC_OR_DEPARTMENT_OTHER): Payer: Self-pay

## 2024-03-03 ENCOUNTER — Other Ambulatory Visit: Payer: Self-pay

## 2024-03-03 ENCOUNTER — Encounter (HOSPITAL_BASED_OUTPATIENT_CLINIC_OR_DEPARTMENT_OTHER): Payer: Self-pay

## 2024-03-03 ENCOUNTER — Emergency Department (HOSPITAL_BASED_OUTPATIENT_CLINIC_OR_DEPARTMENT_OTHER)
Admission: EM | Admit: 2024-03-03 | Discharge: 2024-03-04 | Disposition: A | Payer: Self-pay | Source: Home / Self Care | Attending: Emergency Medicine | Admitting: Emergency Medicine

## 2024-03-03 DIAGNOSIS — L0201 Cutaneous abscess of face: Secondary | ICD-10-CM

## 2024-03-03 LAB — CBC WITH DIFFERENTIAL/PLATELET
Abs Immature Granulocytes: 0.03 10*3/uL (ref 0.00–0.07)
Basophils Absolute: 0.1 10*3/uL (ref 0.0–0.1)
Basophils Relative: 1 %
Eosinophils Absolute: 0.1 10*3/uL (ref 0.0–0.5)
Eosinophils Relative: 1 %
HCT: 41.8 % (ref 39.0–52.0)
Hemoglobin: 14.3 g/dL (ref 13.0–17.0)
Immature Granulocytes: 0 %
Lymphocytes Relative: 35 %
Lymphs Abs: 3.7 10*3/uL (ref 0.7–4.0)
MCH: 31.2 pg (ref 26.0–34.0)
MCHC: 34.2 g/dL (ref 30.0–36.0)
MCV: 91.3 fL (ref 80.0–100.0)
Monocytes Absolute: 0.7 10*3/uL (ref 0.1–1.0)
Monocytes Relative: 7 %
Neutro Abs: 5.9 10*3/uL (ref 1.7–7.7)
Neutrophils Relative %: 56 %
Platelets: 374 10*3/uL (ref 150–400)
RBC: 4.58 MIL/uL (ref 4.22–5.81)
RDW: 12.9 % (ref 11.5–15.5)
WBC: 10.5 10*3/uL (ref 4.0–10.5)
nRBC: 0 % (ref 0.0–0.2)

## 2024-03-03 LAB — COMPREHENSIVE METABOLIC PANEL WITH GFR
ALT: 23 U/L (ref 0–44)
AST: 17 U/L (ref 15–41)
Albumin: 4.2 g/dL (ref 3.5–5.0)
Alkaline Phosphatase: 87 U/L (ref 38–126)
Anion gap: 9 (ref 5–15)
BUN: 16 mg/dL (ref 6–20)
CO2: 29 mmol/L (ref 22–32)
Calcium: 9.5 mg/dL (ref 8.9–10.3)
Chloride: 101 mmol/L (ref 98–111)
Creatinine, Ser: 1 mg/dL (ref 0.61–1.24)
GFR, Estimated: >60 mL/min
Glucose, Bld: 95 mg/dL (ref 70–99)
Potassium: 4.1 mmol/L (ref 3.5–5.1)
Sodium: 139 mmol/L (ref 135–145)
Total Bilirubin: 0.4 mg/dL (ref 0.0–1.2)
Total Protein: 6.8 g/dL (ref 6.5–8.1)

## 2024-03-03 LAB — LACTIC ACID, PLASMA: Lactic Acid, Venous: 1 mmol/L (ref 0.5–1.9)

## 2024-03-03 MED ORDER — SULFAMETHOXAZOLE-TRIMETHOPRIM 800-160 MG PO TABS
1.0000 | ORAL_TABLET | Freq: Once | ORAL | Status: AC
  Administered 2024-03-04: 1 via ORAL
  Filled 2024-03-03: qty 1

## 2024-03-03 MED ORDER — SULFAMETHOXAZOLE-TRIMETHOPRIM 800-160 MG PO TABS: 1.0000 | ORAL_TABLET | Freq: Two times a day (BID) | ORAL | 0 refills | Status: AC

## 2024-03-03 MED ORDER — IOHEXOL 300 MG/ML  SOLN
75.0000 mL | Freq: Once | INTRAMUSCULAR | Status: AC | PRN
  Administered 2024-03-03: 75 mL via INTRAVENOUS

## 2024-03-03 MED ORDER — FENTANYL CITRATE (PF) 50 MCG/ML IJ SOSY
50.0000 ug | PREFILLED_SYRINGE | Freq: Once | INTRAMUSCULAR | Status: AC
  Administered 2024-03-03: 50 ug via INTRAVENOUS
  Filled 2024-03-03: qty 1

## 2024-03-03 MED ORDER — MORPHINE SULFATE (PF) 2 MG/ML IV SOLN
2.0000 mg | Freq: Once | INTRAVENOUS | Status: AC
  Administered 2024-03-03: 2 mg via INTRAVENOUS
  Filled 2024-03-03: qty 1

## 2024-03-03 NOTE — ED Provider Notes (Signed)
 " [place] [facility] Provider Note   CSN: [id number] Arrival date & time: 03/03/24  1934     History Chief Complaint  Patient presents with   Facial Swelling    HPI: Zachary Anderson is a 39 y.o. male with no pertinent history who presents complaining of worsening swelling and pain to his face. Patient arrived via POV accompanied by girlfriend.  History provided by patient.  No interpreter required during this encounter.  Patient reports that he believes that he was bitten on his face by an insect approximately 2 to 3 days ago.  Reports that he presented to the emergency department 2 days ago.  Patient was prescribed mupirocin  ointment for what was diagnosed as a bug bite.  Patient was also prescribed doxycycline  at that time for chlamydia and was administered ceftriaxone  for gonorrhea.  Patient's understanding that he was prescribed doxycycline  for the bug bite itself, however reports that despite the antibiotics he has had worsening pain and swelling in this region.  Reports that he does have Fleischli vision in his right eye, however no pain with extraocular movements, though he does have a sensation of pressure on the lateral aspect of his eye.  Patient's recorded medical, surgical, social, medication list and allergies were reviewed in the Snapshot window as part of the initial history.   Prior to Admission medications  Medication Sig Start Date End Date Taking? Authorizing Provider  sulfamethoxazole -trimethoprim  (BACTRIM  DS) 800-160 MG tablet Take 1 tablet by mouth 2 (two) times daily for 7 days. 03/03/24 03/10/24 Yes Rogelia Jerilynn RAMAN, MD  acetaminophen  (TYLENOL ) 500 MG tablet Take 1,500 mg by mouth every 6 (six) hours as needed. For pain    [provider]  doxycycline  (VIBRAMYCIN ) 100 MG capsule Take 1 capsule (100 mg total) by mouth 2 (two) times daily. One po bid x 7 days 03/01/24   Palumbo, April, MD  ibuprofen  (ADVIL ) 600 MG tablet Take  1 tablet (600 mg total) by mouth every 6 (six) hours as needed. 02/10/24   Roselyn Carlin NOVAK, MD  ketoconazole  (NIZORAL ) 2 % shampoo Apply 1 Application topically 2 (two) times a week. 05/01/22   Branham, Victoria C, MD  mupirocin  cream (BACTROBAN ) 2 % Apply 1 Application topically 2 (two) times daily. 02/22/24   Neysa Thersia RAMAN, PA-C  mupirocin  cream (BACTROBAN ) 2 % Apply 1 Application topically 2 (two) times daily. 03/01/24   Palumbo, April, MD     Allergies: Patient has no known allergies.   Review of Systems   ROS as per HPI  Physical Exam Updated Vital Signs BP 106/83 (BP Location: Right Arm)   Pulse 99   Temp 98.8 F (37.1 C) (Oral)   Resp 17   SpO2 95%  Physical Exam Vitals and nursing note reviewed.  Constitutional:      General: He is not in acute distress.    Appearance: He is well-developed.  HENT:     Head: Normocephalic and atraumatic.  Eyes:     Extraocular Movements: Extraocular movements intact.     Conjunctiva/sclera: Conjunctivae normal.     Pupils: Pupils are equal, round, and reactive to light.     Comments: Snellen chart evaluation 20/20 OS, 20/25 OD  Cardiovascular:     Rate and Rhythm: Normal rate and regular rhythm.     Heart sounds: No murmur heard. Pulmonary:     Effort: Pulmonary effort is normal. No respiratory distress.     Breath sounds: Normal breath sounds.  Abdominal:     Palpations: Abdomen is soft.     Tenderness: There is no abdominal tenderness.  Musculoskeletal:        General: No swelling.     Cervical back: Neck supple.  Skin:    General: Skin is warm and dry.     Capillary Refill: Capillary refill takes less than 2 seconds.  Neurological:     Mental Status: He is alert.  Psychiatric:        Mood and Affect: Mood normal.        ED Course/ Medical Decision Making/ A&P  Procedures .Incision and Drainage  Date/Time: 03/04/2024 12:02 AM  Performed by: Rogelia Jerilynn RAMAN, MD Authorized by: Rogelia Jerilynn RAMAN, MD   Consent:     Consent obtained:  Verbal   Consent given by:  Patient   Risks, benefits, and alternatives were discussed: yes     Risks discussed:  Bleeding, damage to other organs, incomplete drainage, infection and pain   Alternatives discussed:  No treatment, observation and referral Universal protocol:    Imaging studies available: yes     Immediately prior to procedure, a time out was called: yes     Patient identity confirmed:  Verbally with patient Location:    Type:  Abscess   Size:  1cm x1cm x1.7cm   Location:  Head   Head location:  Face Pre-procedure details:    Skin preparation:  Povidone-iodine Anesthesia:    Anesthesia method: IV fentanyl . Procedure type:    Complexity:  Complex Procedure details:    Ultrasound guidance: no     Needle aspiration: no     Incision types:  Single straight   Incision depth:  Subcutaneous   Wound management:  Probed and deloculated   Drainage:  Bloody and purulent   Drainage amount:  Moderate   Wound treatment:  Wound left open   Packing materials:  None Post-procedure details:    Procedure completion:  Tolerated well, no immediate complications    Medications Ordered in ED Medications  sulfamethoxazole -trimethoprim  (BACTRIM  DS) 800-160 MG per tablet 1 tablet (has no administration in time range)  morphine  (PF) 2 MG/ML injection 2 mg (2 mg Intravenous Given 03/03/24 2050)  iohexol  (OMNIPAQUE ) 300 MG/ML solution 75 mL (75 mLs Intravenous Contrast Given 03/03/24 2132)  fentaNYL  (SUBLIMAZE ) injection 50 mcg (50 mcg Intravenous Given 03/03/24 2333)    Medical Decision Making:   Zachary Anderson is a 39 y.o. male who presents for facial pain as per above.  Physical exam is pertinent for significant erythema, swelling to the right cheek.   The differential includes but is not limited to cellulitis, abscess, orbital cellulitis, preseptal cellulitis.  Independent historian: None  External data reviewed: Notes: Reviewed ED notes from 2 days  ago  Initial Plan:  Screening labs including CBC and Metabolic panel to evaluate for infectious or metabolic etiology of disease.  Screening blood culture and lactic to evaluate for sepsis CT orbits to evaluate for cellulitis versus abscess versus pre versus preseptal cellulitis Objective evaluation as below reviewed   Labs: Ordered, Independent interpretation, and Details: CBC without leukocytosis, anemia, thrombocytopenia.  CMP without AKI, emergent electro gentian, emergent LFT abnormality, lactic acid WNL  Radiology: Ordered, Independent interpretation, Details: Personally viewed CT, I do appreciate cutaneous abscess, I do not appreciate any postseptal fullness or abnormality within the orbit, and All images reviewed independently.  Agree with radiology report at this time.   CT Orbits W Contrast Result Date: 03/03/2024 EXAM: CT ORBITS  WITH CONTRAST 03/03/2024 09:43:39 PM TECHNIQUE: CT of the orbits was performed with the administration of 75 mL iohexol  (OMNIPAQUE ) 300 MG/ML solution. Multiplanar reformatted images are provided for review. Automated exposure control, iterative reconstruction, and/or weight based adjustment of the mA/kV was utilized to reduce the radiation dose to as low as reasonably achievable. COMPARISON: None available. CLINICAL HISTORY: Periorbital cellulitis. Periorbital cellulitis. FINDINGS: ORBITS: Globes are intact. Normal extraocular muscles. Normal optic nerve-sheath complexes. No hematoma or inflammatory change. No mass. SOFT TISSUES: There is a small subcutaneous fluid collection within the lateral right periorbital soft tissues measuring 1.7 x 1.0 cm. SINUSES AND MASTOIDS: No acute abnormality. BONES: No acute abnormality. IMPRESSION: 1. Small subcutaneous abscess within the lateral right periorbital soft tissues measuring 1.7 x 1.0 cm. Electronically signed by: Franky Stanford MD 03/03/2024 10:08 PM EST RP Workstation: [id number]   DG Foot Complete Right Result Date:  02/10/2024 EXAM: 3 OR MORE VIEW(S) XRAY OF THE FOOT 02/10/2024 12:27:00 AM COMPARISON: None available. CLINICAL HISTORY: History of injury. FINDINGS: BONES AND JOINTS: No acute fracture. No malalignment. SOFT TISSUES: Unremarkable. IMPRESSION: 1. No acute fracture or dislocation. Electronically signed by: Morgane Naveau MD 02/10/2024 12:36 AM EST RP Workstation: [id number]    EKG/Medicine tests: Not indicated EKG Interpretation:    Interventions: Morphine , fentanyl , Bactrim   See the EMR for full details regarding lab and imaging results.  Patient presents to the emergency department for erythema and swelling to the face.  Reportedly had bug bite and was prescribed in person.  Chart review reveals that patient was prescribed doxycycline  for empiric treatment treatment of chlamydia rather than treatment of cellulitis, however regardless patient reports that he has been adherent to this medication, and this would be an appropriate medication for a purulent cellulitis and patient has had clinical worsening despite this.  Patient is vitally stable, protecting his airway, however given sensation of pressure on his eye, worsening swelling, do feel that patient requires CT imaging to evaluate for underlying cellulitis, periorbital cellulitis.  Labs also indicated as per initial plan above.  Labs obtained and are reassuring, patient is afebrile, no leukocytosis, normal lactic acid, therefore patient without sepsis.  Visual acuity reassuring, CT orbits less concerning for orbital cellulitis.  Patient does have abscess, I did consult ophthalmology and spoke with Dr. Nonah who felt that it would be reasonable for me to perform a bedside incision and drainage of superficial abscess, thus this was performed.  Will start patient on Bactrim , feel that it is reasonable for patient to trial outpatient management given we have newly obtained source control via I&D, however discussed that he should have a very low threshold  for return if he does not have improvement or he is worsening, patient expresses understanding.  Discharged in stable condition with plan for follow-up with pathology.  Presentation is most consistent with acute complicated illness and I did consider and rule out acute life/limb-threatening illness  Discussion of management or test interpretations with external provider(s): Dr. Nonah, ophthalmology  Risk Drugs:Prescription drug management and Parenteral controlled substances  Disposition: DISCHARGE: I believe that the patient is safe for discharge home with outpatient follow-up. Patient was informed of all pertinent physical exam, laboratory, and imaging findings. Patient's suspected etiology of their symptom presentation was discussed with the patient and all questions were answered. We discussed following up with PCP, ophthalmology. I provided thorough ED return precautions. The patient feels safe and comfortable with this plan.  MDM generated using voice dictation software and may contain dictation errors.  Please contact me for any clarification or with any questions.  Clinical Impression:  1. Facial abscess      Discharge   Final Clinical Impression(s) / ED Diagnoses Final diagnoses:  Facial abscess    Rx / DC Orders ED Discharge Orders          Ordered    sulfamethoxazole -trimethoprim  (BACTRIM  DS) 800-160 MG tablet  2 times daily        03/03/24 2351             Rogelia Jerilynn RAMAN, MD 03/04/24 0004  "

## 2024-03-03 NOTE — Discharge Instructions (Addendum)
 Zachary Anderson  Thank you for allowing us  to take care of you today.  You came to the Emergency Department today because you had worsening pain and swelling around your right ear where you were recently bit by a bug.  You developed a soft tissue infection (cellulitis) as well as a pocket of infection (abscess).  Given this was near your eye and you are having some eye discomfort we did get a CT which does not show any abnormality of your eyes.  We also spoke with the ophthalmologist, the eye doctor, and the position of the abscess is not significantly concerning for infection of your eye at this time.  We did open up the abscess and drained the infection from inside.  You do need to finish your course of doxycycline , we are also going to start you on Bactrim , different antibiotic to treat infection.  If you do not improve after 48 on the antibiotic, or worsen despite this antibiotic with worsening pain, swelling, new or different visual symptoms, that would be a reason to come back to the emergency department for evaluation for admission for IV antibiotics.  We will give you a referral to follow-up with the eye doctor, Dr. Nonah.   To-Do: 1. Please follow-up with your primary doctor within 1 - 2 weeks / as soon as possible.   Please return to the Emergency Department or call 911 if you experience have worsening of your symptoms, or do not get better, above symptoms, chest pain, shortness of breath, severe or significantly worsening pain, high fever, severe confusion, pass out or have any reason to think that you need emergency medical care.   We hope you feel better soon.   Mitzie Later, MD Department of Emergency Medicine [facility]

## 2024-03-03 NOTE — ED Triage Notes (Addendum)
 Pt is coming in for a spot on his face below his right eye that is very swollen and red, pt states he came here on the 3rd and was prescribed Abx and it has not gotten any better since then. He is other wise stable at this time in triage, but the swelling is starting to encompass the eye at this time. Pt can see out of the rt eye but it is a little bit fuzzy.

## 2024-03-04 LAB — CULTURE, BLOOD (SINGLE)
Culture: NO GROWTH
Special Requests: ADEQUATE
